# Patient Record
Sex: Male | Born: 1965 | Race: Black or African American | Hispanic: No | Marital: Married | Smoking: Never smoker
Health system: Southern US, Community
[De-identification: ages and names within clinical notes are randomized; demographics above are authoritative.]

## PROBLEM LIST (undated history)

## (undated) DIAGNOSIS — F329 Major depressive disorder, single episode, unspecified: Secondary | ICD-10-CM

## (undated) DIAGNOSIS — I10 Essential (primary) hypertension: Secondary | ICD-10-CM

## (undated) DIAGNOSIS — N183 Chronic kidney disease, stage 3 unspecified: Secondary | ICD-10-CM

## (undated) DIAGNOSIS — F32A Depression, unspecified: Secondary | ICD-10-CM

## (undated) DIAGNOSIS — E7800 Pure hypercholesterolemia, unspecified: Secondary | ICD-10-CM

## (undated) DIAGNOSIS — I5022 Chronic systolic (congestive) heart failure: Secondary | ICD-10-CM

## (undated) DIAGNOSIS — I4729 Other ventricular tachycardia: Secondary | ICD-10-CM

## (undated) DIAGNOSIS — I472 Ventricular tachycardia: Secondary | ICD-10-CM

## (undated) DIAGNOSIS — I428 Other cardiomyopathies: Secondary | ICD-10-CM

## (undated) HISTORY — DX: Other cardiomyopathies: I42.8

## (undated) HISTORY — DX: Other ventricular tachycardia: I47.29

## (undated) HISTORY — PX: NO PAST SURGERIES: SHX2092

## (undated) HISTORY — DX: Ventricular tachycardia: I47.2

## (undated) HISTORY — DX: Chronic kidney disease, stage 3 (moderate): N18.3

## (undated) HISTORY — DX: Chronic kidney disease, stage 3 unspecified: N18.30

## (undated) HISTORY — DX: Chronic systolic (congestive) heart failure: I50.22

---

## 2005-06-06 ENCOUNTER — Ambulatory Visit: Payer: Self-pay | Admitting: Internal Medicine

## 2005-06-24 DIAGNOSIS — I10 Essential (primary) hypertension: Secondary | ICD-10-CM

## 2005-06-24 HISTORY — DX: Essential (primary) hypertension: I10

## 2007-07-13 ENCOUNTER — Ambulatory Visit: Payer: Self-pay | Admitting: Internal Medicine

## 2007-07-13 DIAGNOSIS — E785 Hyperlipidemia, unspecified: Secondary | ICD-10-CM

## 2007-07-13 DIAGNOSIS — J309 Allergic rhinitis, unspecified: Secondary | ICD-10-CM | POA: Insufficient documentation

## 2007-07-13 DIAGNOSIS — I10 Essential (primary) hypertension: Secondary | ICD-10-CM

## 2007-08-20 ENCOUNTER — Ambulatory Visit: Payer: Self-pay | Admitting: Internal Medicine

## 2009-08-04 ENCOUNTER — Ambulatory Visit: Payer: Self-pay | Admitting: Internal Medicine

## 2009-08-04 DIAGNOSIS — G4700 Insomnia, unspecified: Secondary | ICD-10-CM

## 2009-08-04 DIAGNOSIS — F329 Major depressive disorder, single episode, unspecified: Secondary | ICD-10-CM | POA: Insufficient documentation

## 2009-08-04 LAB — CONVERTED CEMR LAB
AST: 19 units/L (ref 0–37)
Alkaline Phosphatase: 84 units/L (ref 39–117)
Bilirubin Urine: NEGATIVE
Bilirubin, Direct: 0.2 mg/dL (ref 0.0–0.3)
CO2: 30 meq/L (ref 19–32)
Chloride: 105 meq/L (ref 96–112)
HDL: 55.3 mg/dL (ref >39.00)
Ketones, ur: NEGATIVE mg/dL
LDL Cholesterol: 105 mg/dL — ABNORMAL HIGH (ref 0–99)
Leukocytes, UA: NEGATIVE
Nitrite: NEGATIVE
Potassium: 4 meq/L (ref 3.5–5.1)
Sodium: 140 meq/L (ref 135–145)
TSH: 1.49 microintl units/mL (ref 0.35–5.50)
Total Bilirubin: 0.5 mg/dL (ref 0.3–1.2)
Total CHOL/HDL Ratio: 3
Total Protein: 8 g/dL (ref 6.0–8.3)
Triglycerides: 40 mg/dL (ref 0.0–149.0)
Urine Glucose: NEGATIVE mg/dL
VLDL: 8 mg/dL (ref 0.0–40.0)
pH: 5.5 (ref 5.0–8.0)

## 2009-08-05 LAB — CONVERTED CEMR LAB
Basophils Absolute: 0 10*3/uL (ref 0.0–0.1)
Basophils Relative: 1 % (ref 0–1)
Eosinophils Absolute: 0.1 10*3/uL (ref 0.0–0.7)
Lymphs Abs: 1.6 10*3/uL (ref 0.7–4.0)
MCHC: 32.6 g/dL (ref 30.0–36.0)
Monocytes Absolute: 0.5 10*3/uL (ref 0.1–1.0)
Monocytes Relative: 9 % (ref 3–12)
Neutrophils Relative %: 61 % (ref 43–77)
Platelets: 177 10*3/uL (ref 150–400)
RBC: 5.69 M/uL (ref 4.22–5.81)
RDW: 14.7 % (ref 11.5–15.5)
WBC: 5.7 10*3/uL (ref 4.0–10.5)

## 2010-01-05 ENCOUNTER — Encounter: Admission: RE | Admit: 2010-01-05 | Discharge: 2010-01-05 | Payer: Self-pay | Admitting: Chiropractic Medicine

## 2010-01-10 ENCOUNTER — Encounter: Admission: RE | Admit: 2010-01-10 | Discharge: 2010-01-10 | Payer: Self-pay | Admitting: Chiropractic Medicine

## 2010-07-22 LAB — CONVERTED CEMR LAB
ALT: 23 units/L (ref 0–53)
AST: 21 units/L (ref 0–37)
Basophils Relative: 2.3 % — ABNORMAL HIGH (ref 0.0–1.0)
CO2: 30 meq/L (ref 19–32)
Calcium: 9.4 mg/dL (ref 8.4–10.5)
Cholesterol: 183 mg/dL (ref 0–200)
Creatinine, Ser: 1.5 mg/dL (ref 0.4–1.5)
Crystals: NEGATIVE
Eosinophils Relative: 1.5 % (ref 0.0–5.0)
GFR calc Af Amer: 66 mL/min
Glucose, Bld: 93 mg/dL (ref 70–99)
Ketones, ur: NEGATIVE mg/dL
LDL Cholesterol: 127 mg/dL — ABNORMAL HIGH (ref 0–99)
Lymphocytes Relative: 28.4 % (ref 12.0–46.0)
MCHC: 34.1 g/dL (ref 30.0–36.0)
MCV: 81.9 fL (ref 78.0–100.0)
Mucus, UA: NEGATIVE
Nitrite: NEGATIVE
PSA: 0.51 ng/mL (ref 0.10–4.00)
RBC: 5.32 M/uL (ref 4.22–5.81)
RDW: 14.3 % (ref 11.5–14.6)
Sodium: 137 meq/L (ref 135–145)
Total Bilirubin: 0.9 mg/dL (ref 0.3–1.2)
Total CHOL/HDL Ratio: 4.3
Total Protein: 8.1 g/dL (ref 6.0–8.3)
Urine Glucose: NEGATIVE mg/dL

## 2010-07-24 NOTE — Assessment & Plan Note (Signed)
Summary: FU---STC   Vital Signs:  Patient profile:   45 year old male Height:      70 inches Weight:      254.25 pounds BMI:     36.61 O2 Sat:      98 % on Room air Temp:     98.3 degrees F oral Pulse rate:   77 / minute BP sitting:   148 / 100  (left arm) Cuff size:   large  Vitals Entered ByZella Ball Ewing (August 04, 2009 10:00 AM)  O2 Flow:  Room air CC: followup/RE   CC:  followup/RE.  History of Present Illness: working 2 jobs, and part -time school - lots of stress and mild increased depressive symptoms, and difficutly staying asleep for 5 yrs;  overall has maintained wt;  some days wakes up with headache;  generally takes a nap during the day, no trouble with driving and sleepiness;  BP at [employer] similar to today;  Pt denies CP, sob, doe, wheezing, orthopnea, pnd, worsening LE edema, palps, dizziness or syncope   Pt denies new neuro symptoms such as other headache, facial or extremity weakness   Preventive Screening-Counseling & Management      Drug Use:  no.    Problems Prior to Update: 1)  Depression  (ICD-311) 2)  Insomnia-sleep Disorder-unspec  (ICD-780.52) 3)  Hyperlipidemia  (ICD-272.4) 4)  Hypertension  (ICD-401.9) 5)  Allergic Rhinitis  (ICD-477.9) 6)  Preventive Health Care  (ICD-V70.0)  Medications Prior to Update: 1)  Benazepril Hcl 40 Mg  Tabs (Benazepril Hcl) .Marland Kitchen.. 1po Qd 2)  Ecotrin Low Strength 81 Mg  Tbec (Aspirin) .Marland Kitchen.. 1po Qd 3)  Unisom 25 Mg  Tabs (Doxylamine Succinate (Sleep)) .... Take 1 By Mouth Qhs 4)  Cetirizine Hcl 10 Mg  Tabs (Cetirizine Hcl) .Marland Kitchen.. 1po Once Daily Prn  Current Medications (verified): 1)  Benazepril Hcl 40 Mg  Tabs (Benazepril Hcl) .Marland Kitchen.. 1po Once Daily 2)  Ecotrin Low Strength 81 Mg  Tbec (Aspirin) .Marland Kitchen.. 1po Once Daily 3)  Unisom 25 Mg  Tabs (Doxylamine Succinate (Sleep)) .... Take 1 By Mouth Qhs 4)  Amlodipine Besylate 5 Mg Tabs (Amlodipine Besylate) .Marland Kitchen.. 1 By Mouth Once Daily 5)  Citalopram Hydrobromide 20 Mg Tabs  (Citalopram Hydrobromide) .Marland Kitchen.. 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         all otherwise negative per pt -   Physical Exam  General:  alert and overweight-appearing.   Head:  normocephalic and atraumatic.   Eyes:  vision grossly intact, pupils equal, and pupils round.  Ears:  R ear normal and L ear normal.   Nose:  no external deformity and no nasal discharge.   Mouth:  no gingival abnormalities and pharynx pink and moist.   Neck:  supple and no masses.   Lungs:  normal respiratory effort and normal breath sounds.   Heart:  normal rate and regular rhythm.   Abdomen:  soft, non-tender, and normal bowel sounds.   Msk:  no joint tenderness and no joint swelling.   Extremities:  no edema, no erythema    Neurologic:  cranial nerves II-XII intact and strength normal in all extremities.   Psych:  not anxious appearing and depressed affect.     Impression & Recommendations:  Problem # 1:  Preventive Health Care (ICD-V70.0)  Overall doing well, age appropriate education and counseling updated and referral for appropriate preventive services done unless declined, immunizations up to date or declined, diet counseling done if overweight, urged to quit smoking if smokes , most recent labs reviewed and current ordered if appropriate, ecg reviewed or declined (interpretation per ECG scanned in the EMR if done); information regarding Medicare Prevention requirements given if appropriate   Orders: TLB-BMP (Basic Metabolic Panel-BMET) (80048-METABOL) TLB-Hepatic/Liver Function Pnl (80076-HEPATIC) TLB-Lipid Panel (80061-LIPID) TLB-TSH (Thyroid Stimulating Hormone) (84443-TSH) TLB-PSA (Prostate Specific Antigen) (84153-PSA) TLB-Udip ONLY (81003-UDIP)  Problem # 2:  INSOMNIA-SLEEP DISORDER-UNSPEC (ICD-780.52)  His updated medication list for this problem includes:    Unisom 25 Mg Tabs (Doxylamine succinate (sleep)) .Marland Kitchen... Take 1 by mouth qhs declines other med at this time,  Problem # 3:  HYPERTENSION (ICD-401.9)  His updated medication list for this problem includes:    Benazepril Hcl 40 Mg Tabs (Benazepril hcl) .Marland Kitchen... 1po once daily    Amlodipine Besylate 5 Mg Tabs (Amlodipine besylate) .Marland Kitchen... 1 by mouth once daily add the amlodipine, f/ BP freq as he does, f/u for any persistent > 140/90  Problem # 4:  DEPRESSION (ICD-311)  His updated medication list for this problem includes:    Citalopram Hydrobromide 20 Mg Tabs (Citalopram hydrobromide) .Marland Kitchen... 1 by mouth once daily mild, treat as above, f/u any worsening signs or symptoms   Complete Medication List: 1)  Benazepril Hcl 40 Mg Tabs (Benazepril hcl) .Marland Kitchen.. 1po once daily 2)  Ecotrin Low Strength 81 Mg Tbec (Aspirin) .Marland Kitchen.. 1po once daily 3)   Unisom 25 Mg Tabs (Doxylamine succinate (sleep)) .... Take 1 by mouth qhs 4)  Amlodipine Besylate 5 Mg Tabs (Amlodipine besylate) .Marland Kitchen.. 1 by mouth once daily 5)  Citalopram Hydrobromide 20 Mg Tabs (Citalopram hydrobromide) .Marland Kitchen.. 1 by mouth once daily  Other Orders: Tdap => 28yrs IM (16109) Admin 1st Vaccine (60454)  Patient Instructions: 1)  you had the tetanus shot today 2)  start the amlodipine 5 mg per day 3)  start the citalopram 20 mg  - 1 per day - Start with HALF pill for 3 days, then whole pill after that 4)  Continue all previous medications as before this visit  5)  Please go to the Lab in the basement for your blood and/or urine tests today 6)  Please schedule a follow-up appointment in 6 mo or sooner if needed 7)  Check your Blood Pressure regularly. If it is above 140/90: you should make an appointment. Prescriptions: CITALOPRAM HYDROBROMIDE 20 MG TABS (CITALOPRAM HYDROBROMIDE) 1 by mouth once daily  #90 x 3   Entered and Authorized by:   Corwin Levins MD   Signed by:   Corwin Levins MD on  08/04/2009   Method used:   Print then Give to Patient   RxID:   [id number] AMLODIPINE BESYLATE 5 MG TABS (AMLODIPINE BESYLATE) 1 by mouth once daily  #90 x 3   Entered and Authorized by:   Corwin Levins MD   Signed by:   Corwin Levins MD on 08/04/2009   Method used:   Print then Give to Patient   RxID:   [id number] BENAZEPRIL HCL 40 MG  TABS (BENAZEPRIL HCL) 1po once daily  #90 x 3   Entered and Authorized by:   Corwin Levins MD   Signed by:   Corwin Levins MD on 08/04/2009   Method used:   Print then Give to Patient   RxID:   [id number]    Immunizations Administered:  Tetanus Vaccine:    Vaccine Type: Tdap    Site: left deltoid    Mfr: GlaxoSmithKline    Dose: 0.5 ml    Route: IM    Given by: Robin Ewing    Exp. Date: 08/19/2011    Lot #: [id number]    VIS given: 05/12/07 version given August 04, 2009.
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
## 2012-02-24 ENCOUNTER — Emergency Department (HOSPITAL_COMMUNITY): Payer: BC Managed Care – PPO

## 2012-02-24 ENCOUNTER — Encounter (HOSPITAL_COMMUNITY): Payer: Self-pay | Admitting: Emergency Medicine

## 2012-02-24 ENCOUNTER — Ambulatory Visit (INDEPENDENT_AMBULATORY_CARE_PROVIDER_SITE_OTHER): Payer: BC Managed Care – PPO | Admitting: Emergency Medicine

## 2012-02-24 ENCOUNTER — Inpatient Hospital Stay (HOSPITAL_COMMUNITY)
Admission: EM | Admit: 2012-02-24 | Discharge: 2012-02-27 | DRG: 127 | Disposition: A | Discharge status: Home / Self Care | Payer: BC Managed Care – PPO | Attending: Internal Medicine | Admitting: Internal Medicine

## 2012-02-24 VITALS — BP 151/105 | HR 114 | Temp 98.8°F | Resp 16 | Ht 70.0 in | Wt 254.4 lb

## 2012-02-24 DIAGNOSIS — I10 Essential (primary) hypertension: Secondary | ICD-10-CM | POA: Diagnosis present

## 2012-02-24 DIAGNOSIS — G4700 Insomnia, unspecified: Secondary | ICD-10-CM

## 2012-02-24 DIAGNOSIS — Z7982 Long term (current) use of aspirin: Secondary | ICD-10-CM

## 2012-02-24 DIAGNOSIS — I5021 Acute systolic (congestive) heart failure: Principal | ICD-10-CM | POA: Diagnosis present

## 2012-02-24 DIAGNOSIS — Z91199 Patient's noncompliance with other medical treatment and regimen due to unspecified reason: Secondary | ICD-10-CM

## 2012-02-24 DIAGNOSIS — E785 Hyperlipidemia, unspecified: Secondary | ICD-10-CM | POA: Diagnosis present

## 2012-02-24 DIAGNOSIS — R079 Chest pain, unspecified: Secondary | ICD-10-CM

## 2012-02-24 DIAGNOSIS — R0602 Shortness of breath: Secondary | ICD-10-CM

## 2012-02-24 DIAGNOSIS — R7989 Other specified abnormal findings of blood chemistry: Secondary | ICD-10-CM

## 2012-02-24 DIAGNOSIS — E876 Hypokalemia: Secondary | ICD-10-CM | POA: Diagnosis present

## 2012-02-24 DIAGNOSIS — J309 Allergic rhinitis, unspecified: Secondary | ICD-10-CM

## 2012-02-24 DIAGNOSIS — Z79899 Other long term (current) drug therapy: Secondary | ICD-10-CM

## 2012-02-24 DIAGNOSIS — I498 Other specified cardiac arrhythmias: Secondary | ICD-10-CM | POA: Diagnosis present

## 2012-02-24 DIAGNOSIS — F3289 Other specified depressive episodes: Secondary | ICD-10-CM | POA: Diagnosis present

## 2012-02-24 DIAGNOSIS — Z9119 Patient's noncompliance with other medical treatment and regimen: Secondary | ICD-10-CM

## 2012-02-24 DIAGNOSIS — E7800 Pure hypercholesterolemia, unspecified: Secondary | ICD-10-CM | POA: Diagnosis present

## 2012-02-24 DIAGNOSIS — R799 Abnormal finding of blood chemistry, unspecified: Secondary | ICD-10-CM | POA: Diagnosis present

## 2012-02-24 DIAGNOSIS — J811 Chronic pulmonary edema: Secondary | ICD-10-CM | POA: Diagnosis present

## 2012-02-24 DIAGNOSIS — F329 Major depressive disorder, single episode, unspecified: Secondary | ICD-10-CM

## 2012-02-24 DIAGNOSIS — R0609 Other forms of dyspnea: Secondary | ICD-10-CM

## 2012-02-24 DIAGNOSIS — I509 Heart failure, unspecified: Secondary | ICD-10-CM | POA: Diagnosis present

## 2012-02-24 DIAGNOSIS — I428 Other cardiomyopathies: Secondary | ICD-10-CM | POA: Diagnosis present

## 2012-02-24 HISTORY — DX: Major depressive disorder, single episode, unspecified: F32.9

## 2012-02-24 HISTORY — DX: Pure hypercholesterolemia, unspecified: E78.00

## 2012-02-24 HISTORY — DX: Depression, unspecified: F32.A

## 2012-02-24 HISTORY — DX: Essential (primary) hypertension: I10

## 2012-02-24 LAB — RAPID URINE DRUG SCREEN, HOSP PERFORMED
Amphetamines: NOT DETECTED
Opiates: NOT DETECTED
Tetrahydrocannabinol: NOT DETECTED

## 2012-02-24 LAB — URINALYSIS, ROUTINE W REFLEX MICROSCOPIC
Glucose, UA: NEGATIVE mg/dL
Hgb urine dipstick: NEGATIVE
Ketones, ur: NEGATIVE mg/dL
Protein, ur: 100 mg/dL — AB

## 2012-02-24 LAB — CBC WITH DIFFERENTIAL/PLATELET
Basophils Relative: 0 % (ref 0–1)
HCT: 42.7 % (ref 39.0–52.0)
Hemoglobin: 14.3 g/dL (ref 13.0–17.0)
Lymphs Abs: 1 10*3/uL (ref 0.7–4.0)
MCHC: 33.5 g/dL (ref 30.0–36.0)
Monocytes Relative: 8 % (ref 3–12)
Platelets: 187 10*3/uL (ref 150–400)
RDW: 15.5 % (ref 11.5–15.5)
WBC: 9.8 10*3/uL (ref 4.0–10.5)

## 2012-02-24 LAB — BASIC METABOLIC PANEL
CO2: 28 mEq/L (ref 19–32)
Chloride: 105 mEq/L (ref 96–112)
Glucose, Bld: 101 mg/dL — ABNORMAL HIGH (ref 70–99)
Potassium: 3.7 mEq/L (ref 3.5–5.1)
Sodium: 142 mEq/L (ref 135–145)

## 2012-02-24 LAB — TROPONIN I: Troponin I: <0.3 ng/mL (ref <0.30)

## 2012-02-24 LAB — CK TOTAL AND CKMB (NOT AT ARMC): Total CK: 192 U/L (ref 7–232)

## 2012-02-24 LAB — URINE MICROSCOPIC-ADD ON

## 2012-02-24 MED ORDER — ONDANSETRON HCL 4 MG/2ML IJ SOLN: 4.0000 mg | Freq: Four times a day (QID) | INTRAMUSCULAR | Status: DC | PRN

## 2012-02-24 MED ORDER — FUROSEMIDE 10 MG/ML IJ SOLN
60.0000 mg | Freq: Two times a day (BID) | INTRAMUSCULAR | Status: DC
  Administered 2012-02-25: 60 mg via INTRAVENOUS
  Filled 2012-02-24 (×2): qty 6

## 2012-02-24 MED ORDER — HYDRALAZINE HCL 20 MG/ML IJ SOLN
10.0000 mg | Freq: Four times a day (QID) | INTRAMUSCULAR | Status: DC | PRN
  Filled 2012-02-24: qty 0.5

## 2012-02-24 MED ORDER — METOPROLOL TARTRATE 50 MG PO TABS
50.0000 mg | ORAL_TABLET | Freq: Two times a day (BID) | ORAL | Status: DC
  Administered 2012-02-24 – 2012-02-26 (×4): 50 mg via ORAL
  Filled 2012-02-24 (×5): qty 1

## 2012-02-24 MED ORDER — ASPIRIN EC 325 MG PO TBEC
325.0000 mg | DELAYED_RELEASE_TABLET | Freq: Every day | ORAL | Status: DC
  Administered 2012-02-24 – 2012-02-26 (×3): 325 mg via ORAL
  Filled 2012-02-24 (×3): qty 1

## 2012-02-24 MED ORDER — ACETAMINOPHEN 325 MG PO TABS
650.0000 mg | ORAL_TABLET | ORAL | Status: DC | PRN
  Administered 2012-02-25: 650 mg via ORAL
  Filled 2012-02-24: qty 2

## 2012-02-24 MED ORDER — NITROGLYCERIN 2 % TD OINT
0.5000 [in_us] | TOPICAL_OINTMENT | Freq: Four times a day (QID) | TRANSDERMAL | Status: DC
  Administered 2012-02-24 – 2012-02-25 (×3): 0.5 [in_us] via TOPICAL
  Filled 2012-02-24: qty 30

## 2012-02-24 MED ORDER — ALBUTEROL SULFATE (5 MG/ML) 0.5% IN NEBU
2.5000 mg | INHALATION_SOLUTION | RESPIRATORY_TRACT | Status: DC
  Administered 2012-02-24: 2.5 mg via RESPIRATORY_TRACT
  Filled 2012-02-24: qty 0.5

## 2012-02-24 MED ORDER — IPRATROPIUM BROMIDE 0.02 % IN SOLN
0.5000 mg | RESPIRATORY_TRACT | Status: DC
  Administered 2012-02-24: 0.5 mg via RESPIRATORY_TRACT
  Filled 2012-02-24: qty 2.5

## 2012-02-24 MED ORDER — HEPARIN SODIUM (PORCINE) 5000 UNIT/ML IJ SOLN
5000.0000 [IU] | Freq: Three times a day (TID) | INTRAMUSCULAR | Status: DC
  Administered 2012-02-24 – 2012-02-27 (×9): 5000 [IU] via SUBCUTANEOUS
  Filled 2012-02-24 (×11): qty 1

## 2012-02-24 MED ORDER — NITROGLYCERIN IN D5W 200-5 MCG/ML-% IV SOLN: 5.0000 ug/min | Freq: Once | INTRAVENOUS | Status: DC

## 2012-02-24 MED ORDER — NITROGLYCERIN 2 % TD OINT
0.5000 [in_us] | TOPICAL_OINTMENT | Freq: Once | TRANSDERMAL | Status: AC
  Administered 2012-02-24: 0.5 [in_us] via TOPICAL
  Filled 2012-02-24: qty 1

## 2012-02-24 MED ORDER — FUROSEMIDE 10 MG/ML IJ SOLN
60.0000 mg | Freq: Once | INTRAMUSCULAR | Status: AC
Start: 2012-02-24 — End: 2012-02-24
  Administered 2012-02-24: 60 mg via INTRAVENOUS
  Filled 2012-02-24: qty 6

## 2012-02-24 MED ORDER — SODIUM CHLORIDE 0.9 % IJ SOLN
3.0000 mL | Freq: Two times a day (BID) | INTRAMUSCULAR | Status: DC
  Administered 2012-02-24 – 2012-02-25 (×3): 3 mL via INTRAVENOUS

## 2012-02-24 NOTE — H&P (Addendum)
Ian Bentley is an 46 y.o. male.    PCP: Oliver Barre, MD   Chief Complaint: Shortness of breath with exertion  HPI: This is a 46 year old, African American male, with past medical history of hypertension, for which he has not been on any medications for the last year and a half. He tells me that his dyspnea started back in January. However, it has been getting progressively worse. Over the last few weeks he's had a dry cough. He is unable to lie flat on his bed because of shortness of breath. He wakes up in the middle of the night feeling breathless as well. He denies any fever or chills. Has had feelings of chest pressure over the last many months, but none currently. He did have some chest pain last night, which has resolved on its own. He denies any palpitations. Denies any leg swelling that he has noticed. Had some nausea, but no vomiting. He's had a few episodes of lightheadedness, but never any syncopal episodes. He decided to seek attention today by going to urgent care Center from where he was sent over to the emergency department. He also admits to generalised weakness.    Home Medications: Prior to Admission medications   Not on File  He is not on any home medications at this time.  Allergies: No Known Allergies  Past Medical History: Past Medical History  Diagnosis Date  . Hypertension     He denies any surgeries in the past.  Social History:  reports that he has never smoked. He does not have any smokeless tobacco history on file. He reports that he does not drink alcohol or use illicit drugs.  Family History: He states that hypertension runs in his family. However, he was unable to specify.  Review of Systems - History obtained from the patient General ROS: positive for  - fatigue Psychological ROS: negative Ophthalmic ROS: negative ENT ROS: negative Allergy and Immunology ROS: negative Hematological and Lymphatic ROS: negative Endocrine ROS:  negative Respiratory ROS: as in hpi Cardiovascular ROS: as in hpi Gastrointestinal ROS: no abdominal pain, change in bowel habits, or black or bloody stools Genito-Urinary ROS: no dysuria, trouble voiding, or hematuria Musculoskeletal ROS: negative Neurological ROS: no TIA or stroke symptoms Dermatological ROS: negative  Physical Examination Blood pressure 168/120, pulse 105, temperature 98.3 F (36.8 C), temperature source Oral, resp. rate 21, SpO2 97.00%.  General appearance: alert, cooperative, appears stated age and no distress Head: Normocephalic, without obvious abnormality, atraumatic Eyes: conjunctivae/corneas clear. PERRL, EOM's intact. Fundi benign. Throat: lips, mucosa, and tongue normal; teeth and gums normal Neck: no adenopathy, no carotid bruit, no JVD, supple, symmetrical, trachea midline and thyroid not enlarged, symmetric, no tenderness/mass/nodules Back: symmetric, no curvature. ROM normal. No CVA tenderness. Resp: decreased air entry at the bases with crackles. no wheezing Cardio: S1, S2 tachycardic, regular. no murmur, click, rub or gallop. JVD present GI: soft, non-tender; bowel sounds normal; no masses,  no organomegaly Extremities: edema 1+ Pulses: 2+ and symmetric Skin: Skin color, texture, turgor normal. No rashes or lesions Lymph nodes: Cervical, supraclavicular, and axillary nodes normal. Neurologic: Alert and oriented x 3. No focal deficits  Laboratory Data: Results for orders placed during the hospital encounter of 02/24/12 (from the past 48 hour(s))  PRO B NATRIURETIC PEPTIDE     Status: Abnormal   Collection Time   02/24/12  2:20 PM      Component Value Range Comment   Pro B Natriuretic peptide (BNP) 6630.0 (*)  0 - 125 pg/mL   TROPONIN I     Status: Normal   Collection Time   02/24/12  2:20 PM      Component Value Range Comment   Troponin I <0.30  <0.30 ng/mL   BASIC METABOLIC PANEL     Status: Abnormal   Collection Time   02/24/12  2:20 PM       Component Value Range Comment   Sodium 142  135 - 145 mEq/L    Potassium 3.7  3.5 - 5.1 mEq/L    Chloride 105  96 - 112 mEq/L    CO2 28  19 - 32 mEq/L    Glucose, Bld 101 (*) 70 - 99 mg/dL    BUN 20  6 - 23 mg/dL    Creatinine, Ser 1.61 (*) 0.50 - 1.35 mg/dL    Calcium 9.0  8.4 - 09.6 mg/dL    GFR calc non Af Amer 48 (*) >90 mL/min    GFR calc Af Amer 55 (*) >90 mL/min   CBC WITH DIFFERENTIAL     Status: Abnormal   Collection Time   02/24/12  2:20 PM      Component Value Range Comment   WBC 9.8  4.0 - 10.5 K/uL    RBC 5.43  4.22 - 5.81 MIL/uL    Hemoglobin 14.3  13.0 - 17.0 g/dL    HCT 04.5  40.9 - 81.1 %    MCV 78.6  78.0 - 100.0 fL    MCH 26.3  26.0 - 34.0 pg    MCHC 33.5  30.0 - 36.0 g/dL    RDW 91.4  78.2 - 95.6 %    Platelets 187  150 - 400 K/uL    Neutrophils Relative 81 (*) 43 - 77 %    Neutro Abs 8.0 (*) 1.7 - 7.7 K/uL    Lymphocytes Relative 11 (*) 12 - 46 %    Lymphs Abs 1.0  0.7 - 4.0 K/uL    Monocytes Relative 8  3 - 12 %    Monocytes Absolute 0.8  0.1 - 1.0 K/uL    Eosinophils Relative 0  0 - 5 %    Eosinophils Absolute 0.0  0.0 - 0.7 K/uL    Basophils Relative 0  0 - 1 %    Basophils Absolute 0.0  0.0 - 0.1 K/uL     Radiology Reports: Dg Chest 2 View  02/24/2012  *RADIOLOGY REPORT*  Clinical Data: Chest pain.  Shortness of breath.  CHEST - 2 VIEW  Comparison: None.  Findings: Cardiac silhouette mildly to moderately enlarged. Pulmonary venous hypertension and mild interstitial pulmonary edema as evidenced by Charyl Dancer B lines.  No confluent airspace consolidation.  No pleural effusions.  Degenerative changes involving the thoracic spine.  IMPRESSION: Mild CHF, with mild to moderate cardiomegaly and mild interstitial pulmonary edema.   Original Report Authenticated By: Arnell Sieving, M.D.     Electrocardiogram: EKG shows sinus tachycardia at 116 beats per minute. Right axis deviation. Possible Q waves in V1 and V2. No acute ST or T-wave changes are noted. Other  nonspecific changes are seen. LVH may also be present  Assessment/Plan  Principal Problem:  *Dyspnea on exertion Active Problems:  Accelerated hypertension  Elevated serum creatinine   #1 dyspnea on exertion: Based on history of orthopnea, PND and clinic examination and testing so far, it appears that he may have congestive heart failure. Uncontrolled hypertension could be the reason. We will initiate Lasix. Nitroglycerin paste  will be utilized. Echocardiogram will be obtained. A TSH level will be checked. Lipid panel will be checked in the morning. He'll be given beta blockers. We will hold off as on his ACE inhibitors due to elevated creatinine. Depending on echocardiogram and his clinical progress cardiology input may be necessary.  #2 accelerated hypertension: This is primarily, because he's been noncompliant. Initiate beta blockers. He'll be given diuretics and nitro paste so, I anticipate some decrease in his blood pressure. Further management will be determined depending on how the patient progresses. Hydralazine as needed will be utilized for extremely high BPs (see orders for parameters).  #3 Chest Pain: None currently but he has experienced this in the last few months. Could be related to elevated BP. Troponin is negative. Will continue to cycle Troponin. CK will be checked. UDS will be checked. ECHO will provide information regarding wall motion.   #4 elevated serum creatinine: Review of his previous labs show that his creatinine was 1.3 in 2011. We will continue to monitor him closely. We'll check a UA. Depending on how the renal function does over the next day or 2 further management may be required.  #4 DVT, prophylaxis will be initiated.  He is a full code.  Further management decisions will depend on results of further testing and patient's response to treatment.  [facility]  [facility] Pager [phone]  02/24/2012, 5:11 PM

## 2012-02-24 NOTE — ED Provider Notes (Signed)
History     CSN: [id number]  Arrival date & time 02/24/12  1300   First MD Initiated Contact with Patient 02/24/12 1312      Chief Complaint  Patient presents with  . Shortness of Breath  . Chest Pain    (Consider location/radiation/quality/duration/timing/severity/associated sxs/prior treatment) The history is provided by the patient.   DETRON CARRAS is a 46 y.o. male with over a year's worth of intermittent dyspnea, occasional chest pain and cough.  The cough seems to be persistent following URI, but doesn't resolve for weeks.  The dyspnea comes and goes but has become worse over ththe last week to days associated with chest pains that are described as intermittent, pressure "an elephant on my chest" are worse when laying down, last about 30 seconds to 1 minute, resolve on sitting u. He has not been sleeping well recently and cannot lie flat or he feels smothered.  He using 2 pillows typically.  He presented today to urgent care with persistent cough and was referred to the ER after an EKG and screening exam.  He denies productive cough, fever, hemoptysis, h/o DVT or PE, no h/o GERD, cough is not worse in the AM, no association with food, no N/V/D or diaphoresis.    Past Medical History  Diagnosis Date  . Hypertension     History reviewed. No pertinent past surgical history.  History reviewed. No pertinent family history.  History  Substance Use Topics  . Smoking status: Never Smoker   . Smokeless tobacco: Not on file  . Alcohol Use: No      Review of Systems Patient denies any fevers or chills, changes in vision, earache, sore throat, neck pain or stiffness, palpitations, syncope, dyspnea, wheezing, abdominal pain, nausea, vomiting, diarrhea, melena, red bloody stools, frequency, dysuria, myalgias, arthralgias, back pain, recent trauma, rash, itching, skin lesions, easy bruising or bleeding, headache, seizures, numbness, tingling or weakness.   Allergies  Review of  patient's allergies indicates no known allergies.  Home Medications  No current outpatient prescriptions on file.  BP 164/107  Pulse 99  Resp 25  SpO2 100%  Physical Exam VITAL SIGNS:   Filed Vitals:   02/25/12 1009  BP: 149/96  Pulse: 95  Temp:   Resp:    CONSTITUTIONAL: Awake, oriented, appears non-toxic HENT: Atraumatic, normocephalic, oral mucosa pink and moist, airway patent. Nares patent without drainage. External ears normal. EYES: Conjunctiva clear, EOMI, PERRLA NECK: Trachea midline, non-tender, supple CARDIOVASCULAR: Normal heart rate, Normal rhythm  PULMONARY/CHEST: Decreased BS bilaterally with faint rales in the bases. Symmetrical breath sounds. Non-tender. ABDOMINAL: Non-distended, soft, non-tender - no rebound or guarding.  BS normal. NEUROLOGIC: Non-focal, moving all four extremities, no gross sensory or motor deficits. EXTREMITIES: No clubbing, cyanosis. 2+ edema, sock lines SKIN: Warm, Dry, No erythema, No rash  ED Course  Procedures (including critical care time)  Labs Reviewed - No data to display No results found.   No diagnosis found.    MDM  HAMZEH TALL is a 46 y.o. male past medical history of chin presenting with chest pain and shortness of breath on and off for a year. He was seen in urgent care today and refer to the emergency department. He says his shortness of breath and cough and worsening over the last week or so has clinical history is consistent with congestive heart failure though he has no history of that. Labs confirm this diagnosis with a BNP of over 6600, he does have a negative  troponin.  His EKG shows a sinus tachycardia with a rate of 116 beats per minute does have a right axis deviation, normal intervals and some nonspecific T-wave changes, there is no evidence of acute ischemia or infarction EKG, there is no prior EKGs to compare with.  Got patient up and walked him in the hallway - he desaturated to high 80's and was  tachypneic.  Pt will need admission as his heart failure has never been diagnosed, treated and now is hypoxemic on exertion.  D/W Dr. Rito Ehrlich from [facility] for admission - thank you for your assistance in caring for this patient.  Admitted, stable to telemetry.        Jones Skene, MD 02/25/12 1118

## 2012-02-24 NOTE — ED Notes (Signed)
Attempted to call report. RN will call back. 

## 2012-02-24 NOTE — ED Notes (Addendum)
Chest pain and SOB off and on for 1 year.  Pt was seen at Urgent Care and told to come to the ED today.  "He has had enough today and the regular doctor is not open today.  Pt has not seen his doctor in 1 yr."  +exertional SOB.  + nonproductive cough.  Dx of HTN but out of BP med since Feb 2012.

## 2012-02-24 NOTE — Progress Notes (Signed)
Patient transferred from ED into 4709 via stretcher. Patient oriented to surrounding in room and demonstrated call bell system to patient. CHF Education and Heart Failure folder provided to help patient understand why he has been brought to heart failure floor. Patient weighed with stand up scale, has voided with urinal, and telemetry box has been applied to patient. Blood pressure elevated in report from ED and upon arrival secondary to history of HTN. Patient complains of no signs or symptoms of pain. Will continue to monitor and report off to oncoming nurse.

## 2012-02-24 NOTE — ED Notes (Signed)
MD at bedside. 

## 2012-02-24 NOTE — Progress Notes (Signed)
  Subjective:    Patient ID: Ian Bentley, male    DOB: 10/13/1965, 46 y.o.   MRN: [id number]  HPI patient has a history of high blood pressure he has not been on medication for the last year. He has significant dyspnea on exertion. Last night for approximately 30 minutes he felt a severe pressure in the center of his chest associated with sitting on his chest. He began radiation of the pain up into his throat associated with nausea but has had no diaphoresis. He gets short of breath fairly easily. The patient states he has been having increasing episodes of this chest pain associated with shortness of breath.    Review of Systems     Objective:   Physical Exam  Constitutional: He appears well-developed and well-nourished.  Neck: Normal range of motion.  Cardiovascular:       I do not hear a gallop or murmur.  Pulmonary/Chest:       He is diminished breath sounds in the bases but no true wheezes. There is no dullness to percussion.   EKG shows a sinus tachycardia with diffuse minimal ST changes inferiorly. He does meet voltage criteria for LVH.        Assessment & Plan:     Patient here with abnormal EKG recurrent episodes of chest pain. His description of the pain is significant to be suspicious for coronary disease. O2 started is not currently having chest pain IV lines assertively placed on a monitor he will be transported to the emergency room for evaluation. History is suspicious for crescendo angina

## 2012-02-24 NOTE — ED Notes (Signed)
Urine sent to lab 

## 2012-02-25 DIAGNOSIS — E7800 Pure hypercholesterolemia, unspecified: Secondary | ICD-10-CM

## 2012-02-25 DIAGNOSIS — E876 Hypokalemia: Secondary | ICD-10-CM

## 2012-02-25 HISTORY — DX: Pure hypercholesterolemia, unspecified: E78.00

## 2012-02-25 LAB — TROPONIN I: Troponin I: <0.3 ng/mL (ref <0.30)

## 2012-02-25 LAB — CBC
Hemoglobin: 13.4 g/dL (ref 13.0–17.0)
MCH: 26.5 pg (ref 26.0–34.0)
RBC: 5.06 MIL/uL (ref 4.22–5.81)
WBC: 10.9 10*3/uL — ABNORMAL HIGH (ref 4.0–10.5)

## 2012-02-25 LAB — LIPID PANEL
HDL: 38 mg/dL — ABNORMAL LOW (ref >39)
LDL Cholesterol: 76 mg/dL (ref 0–99)
Total CHOL/HDL Ratio: 3.5 RATIO
Triglycerides: 94 mg/dL (ref <150)

## 2012-02-25 LAB — COMPREHENSIVE METABOLIC PANEL
ALT: 60 U/L — ABNORMAL HIGH (ref 0–53)
Alkaline Phosphatase: 59 U/L (ref 39–117)
CO2: 29 mEq/L (ref 19–32)
Chloride: 104 mEq/L (ref 96–112)
GFR calc Af Amer: 57 mL/min — ABNORMAL LOW (ref >90)
GFR calc non Af Amer: 49 mL/min — ABNORMAL LOW (ref >90)
Glucose, Bld: 103 mg/dL — ABNORMAL HIGH (ref 70–99)
Potassium: 3 mEq/L — ABNORMAL LOW (ref 3.5–5.1)
Sodium: 141 mEq/L (ref 135–145)
Total Bilirubin: 1.2 mg/dL (ref 0.3–1.2)

## 2012-02-25 LAB — BASIC METABOLIC PANEL
BUN: 19 mg/dL (ref 6–23)
CO2: 29 mEq/L (ref 19–32)
Calcium: 9.3 mg/dL (ref 8.4–10.5)
Creatinine, Ser: 1.96 mg/dL — ABNORMAL HIGH (ref 0.50–1.35)
GFR calc Af Amer: 45 mL/min — ABNORMAL LOW (ref >90)

## 2012-02-25 LAB — TSH: TSH: 1.824 u[IU]/mL (ref 0.350–4.500)

## 2012-02-25 LAB — MAGNESIUM: Magnesium: 2.1 mg/dL (ref 1.5–2.5)

## 2012-02-25 MED ORDER — FUROSEMIDE 10 MG/ML IJ SOLN
INTRAMUSCULAR | Status: AC
  Administered 2012-02-25: 60 mg via INTRAVENOUS
  Filled 2012-02-25: qty 8

## 2012-02-25 MED ORDER — FUROSEMIDE 10 MG/ML IJ SOLN
60.0000 mg | Freq: Every day | INTRAMUSCULAR | Status: DC
  Filled 2012-02-25: qty 6

## 2012-02-25 MED ORDER — POTASSIUM CHLORIDE CRYS ER 20 MEQ PO TBCR
40.0000 meq | EXTENDED_RELEASE_TABLET | ORAL | Status: AC
  Administered 2012-02-25 (×2): 40 meq via ORAL
  Filled 2012-02-25 (×2): qty 2

## 2012-02-25 NOTE — Progress Notes (Signed)
Pt. With 9 beat run of V-Tach. Pt. Asymptomatic and  denies pain or any discomfort. VS stable. MD notified via text page.  Will Continue to monitor pt. For changes in conditon. Alcee Sipos, Cheryll Dessert

## 2012-02-25 NOTE — Clinical Social Work Psychosocial (Signed)
     Clinical Social Work Department BRIEF PSYCHOSOCIAL ASSESSMENT 02/25/2012  Patient:  ROHAAN, DURNIL     Account Number:  [id number]     Admit date:  02/24/2012  Clinical Social Worker:  Hulan Fray  Date/Time:  02/25/2012 02:52 PM  Referred by:  RN  Date Referred:  02/24/2012 Referred for  Advanced Directives   Other Referral:   Interview type:  Patient Other interview type:    PSYCHOSOCIAL DATA Living Status:  WIFE Admitted from facility:   Level of care:   Primary support name:  Alice Primary support relationship to patient:  SPOUSE Degree of support available:   supportive    CURRENT CONCERNS Current Concerns  Other - See comment   Other Concerns:   Advance directives    SOCIAL WORK ASSESSMENT / PLAN Clinical Social Worker received referral for advance directive request. CSW introduced self and explained reason for visit. Patient's mother was at bedside during assessment. CSW provided patient with advance directive packet and MOST form. CSW explained each form and informed patient that documents can be notarized during this hospital admission. CSW also informed patient of other places that may notarize the forms. Patient did not have any further questions. CSW will sign off. Please re-consult if patient wishes to have forms notarized during this admission.   Assessment/plan status:  No Further Intervention Required Other assessment/ plan:   Information/referral to community resources:   Advance directive packet  MOST form    PATIENTS/FAMILYS RESPONSE TO PLAN OF CARE: Patient was agreeable to receiving advance directive packet and MOST form. Patient was appreciative of CSW's assistance.

## 2012-02-25 NOTE — Progress Notes (Signed)
PCP: Oliver Barre, MD  Brief HPI: This is a 46 year old, African American male, with past medical history of hypertension, for which he has not been on any medications for the last year and a half. He mentioned that his dyspnea started back in January. However, it had been getting progressively worse. Over the last few weeks he's had a dry cough. He is unable to lie flat on his bed because of shortness of breath. He wakes up in the middle of the night feeling breathless as well. He denied any fever or chills. Has had feelings of chest pressure over the last many months, but none currently. He did have some chest pain the night prior to admission, which resolved on its own. He denied any palpitations. Denied any leg swelling that he has noticed. Had some nausea, but no vomiting. He's had a few episodes of lightheadedness, but never any syncopal episodes. He decided to seek attention today by going to urgent care Center from where he was sent over to the emergency department. He also admits to generalised weakness.   Past medical history:  Past Medical History  Diagnosis Date  . Hypertension   . High cholesterol   . CHF (congestive heart failure) 02/24/2012  . Bronchitis 02/24/2012    "when I was a kid"  . Shortness of breath 02/24/2012    "laying down; w/exertion"   Consultants: None  Procedures: None  Subjective: Patient is feeling better. Urinating a lot. Denies any chest pain. Ambulating more easily.  Objective: Vital signs in last 24 hours: Temp:  [98 F (36.7 C)-98.8 F (37.1 C)] 98.3 F (36.8 C) (09/03 0510) Pulse Rate:  [79-116] 95  (09/03 1009) Resp:  [16-25] 16  (09/03 0510) BP: (117-172)/(76-125) 149/96 mmHg (09/03 1009) SpO2:  [93 %-100 %] 94 % (09/03 0510) Weight:  [110.2 kg (242 lb 15.2 oz)-112.038 kg (247 lb)] 110.2 kg (242 lb 15.2 oz) (09/03 0510) Weight change:  Last BM Date: 02/24/12  Intake/Output from previous day: 09/02 0701 - 09/03 0700 In: 243 [P.O.:240;  I.V.:3] Out: 4000 [Urine:4000] Intake/Output this shift: Total I/O In: 363 [P.O.:360; I.V.:3] Out: 1225 [Urine:1225]  General appearance: alert, cooperative, appears stated age and no distress Head: Normocephalic, without obvious abnormality, atraumatic Resp: Improved air entry. Less crackles. Cardio: regular rate and rhythm, S1, S2 normal, no murmur, click, rub or gallop GI: soft, non-tender; bowel sounds normal; no masses,  no organomegaly Extremities: Improved edema Pulses: 2+ and symmetric Neurologic: A&Ox3. No focal deficits.  Lab Results:  Basename 02/25/12 0100 02/24/12 1420  WBC 10.9* 9.8  HGB 13.4 14.3  HCT 39.9 42.7  PLT 159 187   BMET  Basename 02/25/12 0100 02/24/12 1420  NA 141 142  K 3.0* 3.7  CL 104 105  CO2 29 28  GLUCOSE 103* 101*  BUN 19 20  CREATININE 1.61* 1.65*  CALCIUM 8.8 9.0  ALT 60* --    Studies/Results: Dg Chest 2 View  02/24/2012  *RADIOLOGY REPORT*  Clinical Data: Chest pain.  Shortness of breath.  CHEST - 2 VIEW  Comparison: None.  Findings: Cardiac silhouette mildly to moderately enlarged. Pulmonary venous hypertension and mild interstitial pulmonary edema as evidenced by Charyl Dancer B lines.  No confluent airspace consolidation.  No pleural effusions.  Degenerative changes involving the thoracic spine.  IMPRESSION: Mild CHF, with mild to moderate cardiomegaly and mild interstitial pulmonary edema.   Original Report Authenticated By: Arnell Sieving, M.D.     Medications:  Scheduled:    .  aspirin EC  325 mg Oral Daily  . furosemide  60 mg Intravenous Once  . furosemide  60 mg Intravenous Q12H  . heparin  5,000 Units Subcutaneous Q8H  . metoprolol tartrate  50 mg Oral BID  . nitroGLYCERIN  0.5 inch Topical Once  . potassium chloride  40 mEq Oral Q4H  . sodium chloride  3 mL Intravenous Q12H  . DISCONTD: albuterol  2.5 mg Nebulization Q4H  . DISCONTD: ipratropium  0.5 mg Nebulization Q4H  . DISCONTD: nitroGLYCERIN  0.5 inch Topical  Q6H  . DISCONTD: nitroGLYCERIN  5 mcg/min Intravenous Once   Continuous:  ZOX:[id number], hydrALAZINE, ondansetron (ZOFRAN) IV  Assessment/Plan:  Principal Problem:  *Dyspnea on exertion Active Problems:  Accelerated hypertension  Elevated serum creatinine  Chest pain  Pulmonary edema    Dyspnea on exertion This was likely due to pulmonary edema. Patient has diuresed well. ECHO is pending. Will decrease dose of lasix. TSH was normal. This is likely from hypertensive heart disease. We will hold off on ACE inhibitors due to elevated creatinine. Depending on echocardiogram cardiology input may be necessary. But this could be done as outpatient. May need to change medications if his EF is low.  Accelerated hypertension This is primarily, because he's been noncompliant. Improved with beta blockers and diuretics. Can discontinue NTP. Hydralazine as needed will be utilized for extremely high BPs (see orders for parameters).   Transient Chest Pain Has ruled out for ACS. Is likely from hypertension. Will need further evaluation as OP. ECHO will give information regarding wall motion.  Elevated serum creatinine Some improvement today. Review of his previous labs show that his creatinine was 1.3 in 2011. We will continue to monitor him closely. UA showed mild proteinuria. May have CKD from HTN. Further evaluation as OP.  Hypokalemia Replete K.  DVT prophylaxis With heparin  Code Status He is a full code.  Disposition Will go home when stable. Anticipate discharge in AM   LOS: 1 day   [facility]  [facility] Pager [phone] 02/25/2012, 11:48 AM

## 2012-02-25 NOTE — Progress Notes (Signed)
Pt. With K+ level of 3.0 this am. MD notified via text page. Will continue to monitor pt. And inform oncoming RN of lab value. Karter Haire, Cheryll Dessert

## 2012-02-26 DIAGNOSIS — I059 Rheumatic mitral valve disease, unspecified: Secondary | ICD-10-CM

## 2012-02-26 LAB — BASIC METABOLIC PANEL
BUN: 21 mg/dL (ref 6–23)
Chloride: 105 mEq/L (ref 96–112)
Creatinine, Ser: 1.7 mg/dL — ABNORMAL HIGH (ref 0.50–1.35)
GFR calc Af Amer: 53 mL/min — ABNORMAL LOW (ref >90)
GFR calc non Af Amer: 46 mL/min — ABNORMAL LOW (ref >90)

## 2012-02-26 LAB — CBC
HCT: 43.9 % (ref 39.0–52.0)
MCHC: 33.3 g/dL (ref 30.0–36.0)
MCV: 79.5 fL (ref 78.0–100.0)
Platelets: 170 10*3/uL (ref 150–400)
RDW: 15.8 % — ABNORMAL HIGH (ref 11.5–15.5)
WBC: 7.6 10*3/uL (ref 4.0–10.5)

## 2012-02-26 MED ORDER — METOPROLOL SUCCINATE ER 50 MG PO TB24
50.0000 mg | ORAL_TABLET | Freq: Every day | ORAL | Status: DC
  Administered 2012-02-26 – 2012-02-27 (×2): 50 mg via ORAL
  Filled 2012-02-26 (×2): qty 1

## 2012-02-26 MED ORDER — ASPIRIN 81 MG PO CHEW
81.0000 mg | CHEWABLE_TABLET | Freq: Every day | ORAL | Status: DC
  Administered 2012-02-27: 81 mg via ORAL
  Filled 2012-02-26: qty 1

## 2012-02-26 MED ORDER — FUROSEMIDE 80 MG PO TABS
120.0000 mg | ORAL_TABLET | Freq: Once | ORAL | Status: AC
  Administered 2012-02-26: 120 mg via ORAL
  Filled 2012-02-26: qty 1

## 2012-02-26 NOTE — Progress Notes (Signed)
*  PRELIMINARY RESULTS* Echocardiogram 2D Echocardiogram has been performed.  Ian Bentley 02/26/2012, 2:03 PM

## 2012-02-26 NOTE — Progress Notes (Signed)
[facility] PROGRESS NOTE  LEJEND DALBY JXB:[id number] DOB: 30-Sep-1965 DOA: 02/24/2012 PCP: Oliver Barre, MD  Assessment/Plan: Acute CHF -Awaiting echocardiogram results -If ejection fraction is less than 40%, plan to start ACE inhibitor if renal function can tolerate Accelerated hypertension -Patient previously on benazepril prior to lost followup -Switch metoprolol tartrate to metoprolol succinate Atypical chest pain -Acute coronary syndrome ruled out -Change aspirin 81 mg daily   Procedures/Studies: Dg Chest 2 View  02/24/2012  *RADIOLOGY REPORT*  Clinical Data: Chest pain.  Shortness of breath.  CHEST - 2 VIEW  Comparison: None.  Findings: Cardiac silhouette mildly to moderately enlarged. Pulmonary venous hypertension and mild interstitial pulmonary edema as evidenced by Charyl Dancer B lines.  No confluent airspace consolidation.  No pleural effusions.  Degenerative changes involving the thoracic spine.  IMPRESSION: Mild CHF, with mild to moderate cardiomegaly and mild interstitial pulmonary edema.   Original Report Authenticated By: Arnell Sieving, M.D.       Code Status: Full  Disposition Plan: Home when medically stable  Subjective: Patient is feeling much better. He states his breathing is 75% better. He denies any chest pain, shortness breath, nausea, vomiting, diarrhea, abdominal pain, headaches.  Objective: Filed Vitals:   02/25/12 2103 02/26/12 0523 02/26/12 1030 02/26/12 1420  BP: 140/96 137/97 121/89 119/85  Pulse: 90 82 89 82  Temp: 98.7 F (37.1 C) 98.2 F (36.8 C) 98.2 F (36.8 C) 98.5 F (36.9 C)  TempSrc: Oral Oral Oral Oral  Resp: 19 18 20 19   Height:      Weight:  108.909 kg (240 lb 1.6 oz)    SpO2: 96% 97% 100% 98%    Intake/Output Summary (Last 24 hours) at 02/26/12 2021 Last data filed at 02/26/12 1828  Gross per 24 hour  Intake    540 ml  Output    325 ml  Net    215 ml   Weight change: -3.13 kg (-6 lb 14.4 oz) Exam:   General:   Pt is alert, follows commands appropriately, not in acute distress  HEENT: No icterus, No thrush, No neck mass, [place]/AT  Cardiovascular: Regular rate and rhythm, S1/S2, no rubs, no gallops  Respiratory: Clear to auscultation bilaterally, no wheezing, no crackles, no rhonchi  Abdomen: Soft, non tender, non distended, bowel sounds present, no guarding  Extremities: Trace edema, No lymphangitis, No petechiae, No rashes, no synovitis  Data Reviewed: Basic Metabolic Panel:  Lab 02/26/12 1308 02/25/12 1756 02/25/12 0820 02/25/12 0100 02/24/12 1420  NA 144 139 -- 141 142  K 3.8 3.8 -- 3.0* 3.7  CL 105 99 -- 104 105  CO2 30 29 -- 29 28  GLUCOSE 99 107* -- 103* 101*  BUN 21 19 -- 19 20  CREATININE 1.70* 1.96* -- 1.61* 1.65*  CALCIUM 8.9 9.3 -- 8.8 9.0  MG -- -- 2.1 -- --  PHOS -- -- -- -- --   Liver Function Tests:  Lab 02/25/12 0100  AST 29  ALT 60*  ALKPHOS 59  BILITOT 1.2  PROT 5.8*  ALBUMIN 3.3*   No results found for this basename: LIPASE:5,AMYLASE:5 in the last 168 hours No results found for this basename: AMMONIA:5 in the last 168 hours CBC:  Lab 02/26/12 0625 02/25/12 0100 02/24/12 1420  WBC 7.6 10.9* 9.8  NEUTROABS -- -- 8.0*  HGB 14.6 13.4 14.3  HCT 43.9 39.9 42.7  MCV 79.5 78.9 78.6  PLT 170 159 187   Cardiac Enzymes:  Lab 02/25/12 0816 02/25/12 0100 02/24/12  1905 02/24/12 1728 02/24/12 1420  CKTOTAL -- -- -- 192 --  CKMB -- -- -- 3.1 --  CKMBINDEX -- -- -- -- --  TROPONINI <0.30 <0.30 <0.30 -- <0.30   BNP: No components found with this basename: POCBNP:5 CBG: No results found for this basename: GLUCAP:5 in the last 168 hours  No results found for this or any previous visit (from the past 240 hour(s)).   Scheduled Meds:   . aspirin EC  325 mg Oral Daily  . furosemide  120 mg Oral Once  . heparin  5,000 Units Subcutaneous Q8H  . metoprolol tartrate  50 mg Oral BID  . sodium chloride  3 mL Intravenous Q12H  . DISCONTD: furosemide  60 mg Intravenous  Daily   Continuous Infusions:    Kyiah Canepa, DO  [facility] Pager [phone]  If 7PM-7AM, please contact night-coverage [email or web address] Password [email or web address] 02/26/2012, 8:21 PM   LOS: 2 days

## 2012-02-26 NOTE — Progress Notes (Signed)
Utilization Review Completed.Ian Bentley T9/09/2011

## 2012-02-27 ENCOUNTER — Encounter (HOSPITAL_COMMUNITY): Payer: Self-pay | Admitting: Physician Assistant

## 2012-02-27 DIAGNOSIS — J811 Chronic pulmonary edema: Secondary | ICD-10-CM

## 2012-02-27 DIAGNOSIS — I509 Heart failure, unspecified: Secondary | ICD-10-CM

## 2012-02-27 DIAGNOSIS — I5021 Acute systolic (congestive) heart failure: Principal | ICD-10-CM

## 2012-02-27 LAB — BASIC METABOLIC PANEL
CO2: 31 mEq/L (ref 19–32)
Calcium: 9.1 mg/dL (ref 8.4–10.5)
Creatinine, Ser: 1.79 mg/dL — ABNORMAL HIGH (ref 0.50–1.35)
GFR calc Af Amer: 50 mL/min — ABNORMAL LOW (ref >90)
GFR calc non Af Amer: 43 mL/min — ABNORMAL LOW (ref >90)
Sodium: 139 mEq/L (ref 135–145)

## 2012-02-27 LAB — MAGNESIUM: Magnesium: 2.2 mg/dL (ref 1.5–2.5)

## 2012-02-27 MED ORDER — FUROSEMIDE 20 MG PO TABS: 20.0000 mg | ORAL_TABLET | Freq: Every day | ORAL | Status: DC

## 2012-02-27 MED ORDER — SPIRONOLACTONE 12.5 MG HALF TABLET: 12.5000 mg | ORAL_TABLET | Freq: Every day | ORAL | Status: DC

## 2012-02-27 MED ORDER — LISINOPRIL 10 MG PO TABS: 10.0000 mg | ORAL_TABLET | Freq: Every day | ORAL | Status: DC

## 2012-02-27 MED ORDER — ASPIRIN 81 MG PO CHEW: 81.0000 mg | CHEWABLE_TABLET | Freq: Every day | ORAL | Status: DC

## 2012-02-27 MED ORDER — FUROSEMIDE 40 MG PO TABS: 40.0000 mg | ORAL_TABLET | Freq: Every day | ORAL | Status: DC

## 2012-02-27 MED ORDER — SPIRONOLACTONE 12.5 MG HALF TABLET
12.5000 mg | ORAL_TABLET | Freq: Every day | ORAL | Status: DC
  Administered 2012-02-27: 12.5 mg via ORAL
  Filled 2012-02-27: qty 1

## 2012-02-27 MED ORDER — METOPROLOL SUCCINATE ER 50 MG PO TB24: 50.0000 mg | ORAL_TABLET | Freq: Every day | ORAL | Status: DC

## 2012-02-27 MED ORDER — LISINOPRIL 10 MG PO TABS
10.0000 mg | ORAL_TABLET | Freq: Every day | ORAL | Status: DC
  Administered 2012-02-27: 10 mg via ORAL
  Filled 2012-02-27: qty 1

## 2012-02-27 MED ORDER — FUROSEMIDE 20 MG PO TABS
20.0000 mg | ORAL_TABLET | Freq: Every day | ORAL | Status: DC
  Administered 2012-02-27: 20 mg via ORAL
  Filled 2012-02-27: qty 1

## 2012-02-27 NOTE — Consult Note (Signed)
CARDIOLOGY CONSULT NOTE  Patient ID: Ian Bentley, MRN: [id number], DOB/AGE: 1965-11-02 46 y.o. Admit date: 02/24/2012   Date of Consult: 02/27/2012 Primary Physician: Oliver Barre, MD Primary Cardiologist: New  Chief Complaint: shortness of breath Reason for Consult: newly recognized cardiomyopathy (EF 15-20%)  HPI: Mr. Ian Bentley is a 46 y/o M with no prior cardiac history but a history of hypertension who presented to urgent care with complaints of progressive chest pain/SOB and was diagnosed with newly recognized congestive heart failure. 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 He has not seen a doctor for anything since that time. He started experiencing SOB/DOE, coughing and occasional chest discomfort around Dec 2012. 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 His symptoms continued to worsen with exertion over the last 10 months. He reports "spells" of chest pain, SOB, diaphoresis, nausea, lightheadedness which would prompt him to go sit down at which time the symptoms would alleviate after about 10-15 minutes.During some of these episodes, he would not be able to finish conversations. This would resolve with rest and may not occur again that day. He denies having experienced any one major event of chest pain in the months preceding onset. He has had worsening PND/orthopnea over time. No obvious LEE. He denies any syncope. Has had vertigo lasting less than a minute no more than twice a year without presyncope. Denies alcohol or drug use.  He had an episode 02/24/12 of severe substernal chest pain "like an elephant sitting on my chest" with nausea, SOB, and pain radiating to his neck thus went to urgent  care. He was subsequently referred to the ER where BP was 168/120 on arrival. CXR was consistent with mild CHF and mild-moderate cardiomegaly. Initial BNP 6630 and cardiac enzymes have been negative x 4. UDS negative and TSH WNL. 2D echo demonstrated severe LV dilitation with EF 15-20%, mild MR. Cr has also been elevated of uncertain chronicity, is 1.79 today (peak 1.96 two days prior, was 1.3 in 2011). He has been diuresed with IV Lasix 60mg  9/2, IV 60mg  9/3, 120mg  po 9/4 and has since been transitioned to Lasix 20mg  po as of today. Wt down 247->238 with -4L net diuresis. He was also written to start ACEI and spironolactone today. He denies current chest pain and dyspnea. "I feel better than I've felt in a long time now that they got the fluid off."  He has also had 4 brief episodes NSVT on tele, up to 11beats but was asymptomatic.  Past Medical History  Diagnosis Date  . Hypertension 2007  . High cholesterol 02/25/2012  . CHF (congestive heart failure) 02/24/2012    EF 15-20%  . Bronchitis     "when I was a kid"  . Depression     Previously treated with Citalopram 20mg       Most Recent Cardiac Studies: 2D echo 02/26/12 THIS ADMISSION Study Conclusions - Left ventricle: The cavity size was severely dilated. Wall thickness was normal. Systolic function was severely reduced. The estimated ejection fraction was in the range of 15% to 20%. - Mitral valve: Mild regurgitation. - Left atrium: The atrium was mildly dilated. - Right ventricle: The cavity size was mildly dilated.  Systolic function was mildly reduced. - Right atrium: The atrium was mildly dilated.    Surgical History:  Past Surgical History  Procedure Date  . No past surgeries      Home Meds: Prior to Admission medications   Not on File    Inpatient Medications:   . aspirin  81 mg Oral Daily  . furosemide  120 mg Oral Once  . furosemide  20 mg Oral Daily  . heparin  5,000 Units Subcutaneous Q8H  . lisinopril  10 mg Oral  Daily  . metoprolol succinate  50 mg Oral Daily  . sodium chloride  3 mL Intravenous Q12H  . spironolactone  12.5 mg Oral Daily   Allergies: No Known Allergies  History   Social History  . Marital Status: Married    Spouse Name: Elwin Tsou    Number of Children: 1  . Years of Education: 15   Occupational History  . BUS DRIVER [employer]   Social History Main Topics  . Smoking status: Never Smoker   . Smokeless tobacco: Never Used  . Alcohol Use: No     has never been a drinker  . Drug Use: No     has never used drugs  . Sexually Active: Yes -- Male partner(s)     Married   Other Topics Concern  . Not on file   Social History Narrative   Lives with wife and mother. Has one daughter, 23. Has 1 step-son, 30's, and 1 step-daughter, 30's. Wife has extensive psychiatric history including schizophrenia/biploar. Does not know his father. Has no information about his paternal family history. Has no siblings.     Family History  Problem Relation Age of Onset  . Hypertension Mother 20  . Stroke Mother 91  . Cataracts Mother 60    surgical intervention  . Hypertension Daughter   . Heart disease      Pt unsure, possibly in mother's side  . Sudden death Neg Hx      Review of Systems: General: negative for chills, fever, night sweats Cardiovascular: see above Dermatological: negative for rash Respiratory: Positive for cough and wheezing. Urologic: negative for hematuria Abdominal: Positive for nausea. Negative for vomiting, diarrhea, bright red blood per rectum, melena, or hematemesis Neurologic: negative for visual changes, syncope.  All other systems reviewed and are otherwise negative except as noted above.  Labs:  [facility] 02/25/12 0816 02/25/12 0100 02/24/12 1905 02/24/12 1728 02/24/12 1420  CKTOTAL -- -- -- 192 --  CKMB -- -- -- 3.1 --  TROPONINI <0.30 <0.30 <0.30 -- <0.30   Lab Results  Component Value Date   WBC 7.6 02/26/2012   HGB 14.6  02/26/2012   HCT 43.9 02/26/2012   MCV 79.5 02/26/2012   PLT 170 02/26/2012     Lab 02/27/12 0545 02/25/12 0100  NA 139 --  K 3.8 --  CL 101 --  CO2 31 --  BUN 25* --  CREATININE 1.79* --  CALCIUM 9.1 --  PROT -- 5.8*  BILITOT -- 1.2  ALKPHOS -- 59  ALT -- 60*  AST -- 29  GLUCOSE 87 --   Lab Results  Component Value Date   CHOL 133 02/25/2012   HDL 38* 02/25/2012   LDLCALC 76 02/25/2012   TRIG 94 02/25/2012    Radiology/Studies:  1. Dg Chest 2 View 02/24/2012  *RADIOLOGY REPORT*  Clinical Data: Chest pain.  Shortness of breath.  CHEST - 2 VIEW  Comparison: None.  Findings: Cardiac  silhouette mildly to moderately enlarged. Pulmonary venous hypertension and mild interstitial pulmonary edema as evidenced by Charyl Dancer B lines.  No confluent airspace consolidation.  No pleural effusions.  Degenerative changes involving the thoracic spine.  IMPRESSION: Mild CHF, with mild to moderate cardiomegaly and mild interstitial pulmonary edema.   Original Report Authenticated By: Arnell Sieving, M.D.   2. See above AV:WUJW  Tele: 4 episodes of NSVT 5-11 beats. EKG: 01/25/12 NSR, LVH, with anterior T wave changes and TW inversion V5, biphasic V6, T wave flattening otherwise diffusely  Physical Exam: Blood pressure 124/87, pulse 81, temperature 97.6 F (36.4 C), temperature source Oral, resp. rate 16, height 5\' 10"  (1.778 m), weight 238 lb 3.2 oz (108.047 kg), SpO2 98.00%. General: Well developed, overweight male in no acute distress. Head: Normocephalic, atraumatic, sclera non-icteric, PERRLA, no xanthomas, nares are without discharge. Dentition - fair. Oral membranes without pallor and moist.   Neck: Supple without thyromegaly. Negative for carotid bruits. JVD not elevated. Lungs: Clear bilaterally to auscultation without wheezes, rales, or rhonchi. Breathing is unlabored with adequate expansion. Heart: RRR with S1 S2 +S4. No murmurs, rubs, or gallops appreciated. Abdomen: Soft, non-tender, non-distended  with normoactive bowel sounds. No rebound/guarding. No obvious abdominal masses. Msk:  Strength and tone appear normal for age. Extremities: No clubbing or cyanosis. No edema.  Distal pedal pulses are 2+ and equal bilaterally. Neuro: Alert and oriented X 3. Moves all extremities spontaneously. Psych:  Responds to questions appropriately with a normal affect.   Assessment and Plan:  1. Acute systolic congestive heart failure - newly diagnosed with EF 15-20%. Discussed with Dr. Antoine Poche. The patient is eager to be discharged and he is euvolemic. Would continue med rx with Lasix, spironolactone and ACEI for now but check BMET in our office on Monday 03/02/12. Continue BB. Will likely need cath at some point but Dr. Antoine Poche would like him to f/u in office first to demonstrate stable renal function. Although Cr 1.79, his CrCl is 67mL/min. Continue ASA. Can also rx SL NTG at discharge. Pt educated regarding his new diagnosis. He seems motivated. Will need eval for ICD placement if EF remains low after 3 months despite med rx. 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 Would advise him not to drive for now. 2. Hypertension - continue current regimen. Counseled patient on importance of compliance. 3. H/o hyperlipidemia - lipids reasonable. If he does have evidence of CAD by ischemic testing down the road, would recommend statin therapy. 4. Renal insufficiency - uncertain chronicity. May be related to uncontrolled HTN but may require further workup as directed by IM. Follow with new addition of meds. CrCl is >60. 5. Sleep disorder - ?possible OSA, sleep study may be needed. 6. NSVT - continue BB. See above.  Signed, Dayna Dunn PA-C 02/27/2012, 12:16 PM   History and all data above reviewed.  Patient examined.  I agree with the findings as above.  Progressive dyspnea over many months.  No history of chest pain.  He does have HTN which he reports as being very poorly controlled  since he stopped his medications.  EF 15% by echo with global hypokinesis.  The patient exam reveals COR:RRR, positive S4, no S3  ,  Lungs: Clear  ,  Abd: Positive bowel sounds, no rebound no guarding, Ext No edema  .  All available labs, radiology testing, previous records reviewed. Agree with documented assessment and plan. Dilated cardiomyopathy.  I suspect secondary  to HTN.  I agree with current management and plans per Dr. Arbutus Leas.  He will need a right and left heart cath in the future although he would like to defer this.  I think that this is reasonable. We will see him in follow up within the week and we will check a BMET prior to this as he is just starting the lisinopril and spironolactone.  He has renal insufficiency again probably related to HTN.  Fayrene Fearing Laquanta Hummel  1:36 PM  02/27/2012

## 2012-02-27 NOTE — Discharge Summary (Signed)
Physician Discharge Summary  Ian Bentley BMW:[id number] DOB: [birth year] DOA: 02/24/2012  PCP: Ian Barre, MD  Admit date: 02/24/2012 Discharge date: 02/27/2012  Recommendations for Outpatient Follow-up:  1. Pt will need to follow up with PCP in 2-3 weeks post discharge 2. Please obtain BMP to evaluate electrolytes and kidney function 3. Please also check CBC to evaluate Hg and Hct levels 4. Blood work at [facility] September 9 5. Cardiology appointment September 11  Discharge Diagnoses:  Acute systolic CHF  -Clinically improved, stable  -Echocardiogram shows ejection fraction 15-20%  -Start patient on lisinopril, spironolactone  -Continue low-dose furosemide, metoprolol succinate  -Monitor creatinine, lites Cardiomyopathy  -Newly diagnosed, suspect etiology may be ischemic  -Consult cardiology for possible additional workup while in patient  -May ultimately need AICD, and heart catheterization Accelerated hypertension  -Start lisinopril, continue metoprolol succinate  Atypical chest pain  -Acute coronary syndrome ruled out  -Change aspirin 81 mg daily    Discharge Condition: Stable  Disposition:  Follow-up Information    Follow up with [facility]. (Labwork on Monday 03/02/12 to check kidney function and electrolytes - can come anytime between 8:30am and 4:30pm)    Contact information:   [address] [place] [address] [phone]      Follow up with Ian Newcomer, PA. (03/04/12 at 12:10pm)    Contact information:   [facility] [address] [place] [address] [phone]       Follow up with Ian Barre, MD in 2 weeks.   Contact information:   [address] [facility] [address] [place] [address] [place] [address] [phone]          Diet: Cardiac Wt Readings from Last 3 Encounters:  02/27/12 108.047 kg (238 lb 3.2 oz)  02/24/12 115.395 kg (254 lb 6.4 oz)  08/04/09  115.327 kg (254 lb 4 oz)    History of present illness:  46 year old African American male noncompliant with hypertensive medications presents with progressive dyspnea since January. He also has complaints of orthopnea and a dry cough. Patient also complained of a few months duration of chest discomfort. The combination of the entire symptom complex combined with worsening dyspnea prompted his emergency department visit.  Hospital Course:  The patient was admitted to the hospital with pulmonary edema on his chest x-ray with a working diagnosis of acute congestive heart failure. His initial proBNP was 6630. The patient was treated with intravenous furosemide with significant improvement of his symptoms. He was subsequently transitioned to oral furosemide and tolerated the therapy well. In total, the patient diuresed 4284 cc during his hospitalization. Echocardiogram was performed and revealed an ejection fraction of 15-20% with a dilated RV and mild mitral regurgitation. Based upon these results, the patient was started on lisinopril, spironolactone. He was transitioned to metoprolol succinate prior to discharge. Surgical none meds and EKGs were negative for acute coronary syndrome. Lipids revealed total cholesterol 133, LDL 76, HDL 38, triglycerides 94. TSH was 1.824. Urine toxicology was negative. The patient did have some renal insufficiency with a creatinine of 1.65 on the day of admission. His creatinine remained stable at 1.79 on the day of discharge.  Because of his cardiomyopathy, cardiology was consulted for possible further intervention. They offered the patient a left and right heart catheterization, but the patient preferred this to be done at a later time. Given the patient's clinical stability, cardiology thought it was okay to discharge the patient. The  patient has followup with the cardiologist on 03/04/2012.  Consultants: [facility]  Discharge Exam: Filed Vitals:   02/27/12  1440  BP: 113/77  Pulse: 74  Temp: 98.5 F (36.9 C)  Resp: 18   Filed Vitals:   02/26/12 2137 02/27/12 0434 02/27/12 1018 02/27/12 1440  BP: 131/94 129/83 124/87 113/77  Pulse: 93 81 81 74  Temp: 98.2 F (36.8 C) 97.7 F (36.5 C) 97.6 F (36.4 C) 98.5 F (36.9 C)  TempSrc: Oral Oral Oral Oral  Resp: 20 20 16 18   Height:      Weight:  108.047 kg (238 lb 3.2 oz)    SpO2: 96% 98% 98% 98%   General: A&O x 3, NAD, pleasant, cooperative Cardiovascular: RRR, no rub, no gallop, no S3 Respiratory: CTAB, no wheeze, no rhonchi Abdomen:soft, nontender, nondistended, positive bowel sounds  Discharge Instructions  Discharge Orders    Future Appointments: Provider: Department: Dept Phone: Center:   03/02/2012 11:50 AM [facility] [phone] [email or web address]   03/04/2012 12:10 PM Ian Lecher, PA [facility] [phone] [facility]     Future Orders Please Complete By Expires   Diet - low sodium heart healthy      Increase activity slowly        Medication List  As of 02/27/2012  5:34 PM   TAKE these medications         aspirin 81 MG chewable tablet   Chew 1 tablet (81 mg total) by mouth daily.      furosemide 20 MG tablet   Commonly known as: LASIX   Take 1 tablet (20 mg total) by mouth daily.      lisinopril 10 MG tablet   Commonly known as: PRINIVIL,ZESTRIL   Take 1 tablet (10 mg total) by mouth daily.      metoprolol succinate 50 MG 24 hr tablet   Commonly known as: TOPROL-XL   Take 1 tablet (50 mg total) by mouth daily. Take with or immediately following a meal.      spironolactone 12.5 mg Tabs   Commonly known as: ALDACTONE   Take 0.5 tablets (12.5 mg total) by mouth daily.             The results of significant diagnostics from this hospitalization (including imaging, microbiology, ancillary and laboratory) are listed below for reference.    Significant Diagnostic Studies: Dg Chest 2 View  02/24/2012  *RADIOLOGY REPORT*   Clinical Data: Chest pain.  Shortness of breath.  CHEST - 2 VIEW  Comparison: None.  Findings: Cardiac silhouette mildly to moderately enlarged. Pulmonary venous hypertension and mild interstitial pulmonary edema as evidenced by Charyl Dancer B lines.  No confluent airspace consolidation.  No pleural effusions.  Degenerative changes involving the thoracic spine.  IMPRESSION: Mild CHF, with mild to moderate cardiomegaly and mild interstitial pulmonary edema.   Original Report Authenticated By: Arnell Sieving, M.D.      Microbiology: No results found for this or any previous visit (from the past 240 hour(s)).   Labs: Basic Metabolic Panel:  Lab 02/27/12 2956 02/27/12 0545 02/26/12 0625 02/25/12 1756 02/25/12 0820 02/25/12 0100 02/24/12 1420  NA -- 139 144 139 -- 141 142  K -- 3.8 3.8 -- -- -- --  CL -- 101 105 99 -- 104 105  CO2 -- 31 30 29  -- 29 28  GLUCOSE -- 87 99 107* -- 103* 101*  BUN -- 25* 21 19 -- 19 20  CREATININE -- 1.79* 1.70* 1.96* --  1.61* 1.65*  CALCIUM -- 9.1 8.9 9.3 -- 8.8 9.0  MG 2.2 -- -- -- 2.1 -- --  PHOS -- -- -- -- -- -- --   Liver Function Tests:  Lab 02/25/12 0100  AST 29  ALT 60*  ALKPHOS 59  BILITOT 1.2  PROT 5.8*  ALBUMIN 3.3*   No results found for this basename: LIPASE:5,AMYLASE:5 in the last 168 hours No results found for this basename: AMMONIA:5 in the last 168 hours CBC:  Lab 02/26/12 0625 02/25/12 0100 02/24/12 1420  WBC 7.6 10.9* 9.8  NEUTROABS -- -- 8.0*  HGB 14.6 13.4 14.3  HCT 43.9 39.9 42.7  MCV 79.5 78.9 78.6  PLT 170 159 187   Cardiac Enzymes:  Lab 02/25/12 0816 02/25/12 0100 02/24/12 1905 02/24/12 1728 02/24/12 1420  CKTOTAL -- -- -- 192 --  CKMB -- -- -- 3.1 --  CKMBINDEX -- -- -- -- --  TROPONINI <0.30 <0.30 <0.30 -- <0.30   BNP: No components found with this basename: POCBNP:5 CBG: No results found for this basename: GLUCAP:5 in the last 168 hours  Time coordinating discharge:  Greater than 30 minutes  Signed:  Chinmayi Rumer,  Zalayah Pizzuto, DO [facility] Pager: [phone] 02/27/2012, 5:34 PM

## 2012-02-27 NOTE — Care Management Note (Signed)
    Page 1 of 1   02/27/2012     3:22:49 PM   CARE MANAGEMENT NOTE 02/27/2012  Patient:  Ian Bentley, Ian Bentley   Account Number:  [id number]  Date Initiated:  02/27/2012  Documentation initiated by:  Tera Mater  Subjective/Objective Assessment:   46yo male admitted with CHF.  Pt. lives with spouse at home     Action/Plan:   Discharge planning   Anticipated DC Date:  02/28/2012   Anticipated DC Plan:  HOME W HOME HEALTH SERVICES      DC Planning Services  CM consult      [facility] Choice  HOME HEALTH   Choice offered to / List presented to:  C-1 Patient           Status of service:  In process, will continue to follow Medicare Important Message given?   (If response is "NO", the following Medicare IM given date fields will be blank) Date Medicare IM given:   Date Additional Medicare IM given:    Discharge Disposition:    Per UR Regulation:  Reviewed for med. necessity/level of care/duration of stay  If discussed at Long Length of Stay Meetings, dates discussed:    Comments:  02/27/12 1100 Tera Mater, RN, BSN NCM [phone] In to completed HF Screen.  Spoke with pt. about [facility] services as well as giving pt. list of HH agenices.  Pt. stated he would like to consider this.  NCM also spoke to pt. about Living Better with Heart Failure booklet and pt. stated he does weigh himself everyday, and is able to obtain his medications, however he is not compliant with taking them sometimes.  Encouraged pt. to take medications as directed.  NCM will follow for discharge needs.

## 2012-02-27 NOTE — Progress Notes (Signed)
[facility] PROGRESS NOTE  Ian Bentley WUJ:[id number] DOB: 07-03-1965 DOA: 02/24/2012 PCP: Oliver Barre, MD  Assessment/Plan: Acute systolic CHF  -Clinically improved, stable -Echocardiogram shows ejection fraction 15-20% -Start patient on lisinopril, spironolactone -Continue low-dose furosemide, metoprolol succinate -Monitor creatinine Cardiomyopathy -Newly diagnosed, suspect etiology may be ischemic -Consult cardiology for possible additional workup while in patient -May ultimately need AICD Accelerated hypertension  -Start lisinopril, continue metoprolol succinate  Atypical chest pain  -Acute coronary syndrome ruled out  -Change aspirin 81 mg daily    disposition: Home today if no further workup by cardiology   Procedures/Studies: Dg Chest 2 View  02/24/2012  *RADIOLOGY REPORT*  Clinical Data: Chest pain.  Shortness of breath.  CHEST - 2 VIEW  Comparison: None.  Findings: Cardiac silhouette mildly to moderately enlarged. Pulmonary venous hypertension and mild interstitial pulmonary edema as evidenced by Charyl Dancer B lines.  No confluent airspace consolidation.  No pleural effusions.  Degenerative changes involving the thoracic spine.  IMPRESSION: Mild CHF, with mild to moderate cardiomegaly and mild interstitial pulmonary edema.   Original Report Authenticated By: Arnell Sieving, M.D.        Subjective: Patient is feeling well at this time; he denies any chest pain, shortness breath, nausea, vomiting, orthopnea, PND, abdominal pain, rashes, fevers, chills.  Objective: Filed Vitals:   02/26/12 1030 02/26/12 1420 02/26/12 2137 02/27/12 0434  BP: 121/89 119/85 131/94 129/83  Pulse: 89 82 93 81  Temp: 98.2 F (36.8 C) 98.5 F (36.9 C) 98.2 F (36.8 C) 97.7 F (36.5 C)  TempSrc: Oral Oral Oral Oral  Resp: 20 19 20 20   Height:      Weight:    108.047 kg (238 lb 3.2 oz)  SpO2: 100% 98% 96% 98%    Intake/Output Summary (Last 24 hours) at 02/27/12 0756 Last data  filed at 02/26/12 1828  Gross per 24 hour  Intake    480 ml  Output    325 ml  Net    155 ml   Weight change: -0.862 kg (-1 lb 14.4 oz) Exam:   General:  Pt is alert, follows commands appropriately, not in acute distress  HEENT: No icterus, No thrush,  [place]/AT  Cardiovascular: Regular rate and rhythm, S1/S2, no rubs, no gallops  Respiratory: Clear to auscultation bilaterally, no wheezing, no crackles, no rhonchi  Abdomen: Soft, non tender, non distended, bowel sounds present, no guarding  Extremities: No edema, No lymphangitis, No petechiae, No rashes, no synovitis  Data Reviewed: Basic Metabolic Panel:  Lab 02/27/12 9562 02/26/12 0625 02/25/12 1756 02/25/12 0820 02/25/12 0100 02/24/12 1420  NA 139 144 139 -- 141 142  K 3.8 3.8 3.8 -- 3.0* 3.7  CL 101 105 99 -- 104 105  CO2 31 30 29  -- 29 28  GLUCOSE 87 99 107* -- 103* 101*  BUN 25* 21 19 -- 19 20  CREATININE 1.79* 1.70* 1.96* -- 1.61* 1.65*  CALCIUM 9.1 8.9 9.3 -- 8.8 9.0  MG -- -- -- 2.1 -- --  PHOS -- -- -- -- -- --   Liver Function Tests:  Lab 02/25/12 0100  AST 29  ALT 60*  ALKPHOS 59  BILITOT 1.2  PROT 5.8*  ALBUMIN 3.3*   No results found for this basename: LIPASE:5,AMYLASE:5 in the last 168 hours No results found for this basename: AMMONIA:5 in the last 168 hours CBC:  Lab 02/26/12 0625 02/25/12 0100 02/24/12 1420  WBC 7.6 10.9* 9.8  NEUTROABS -- -- 8.0*  HGB 14.6  13.4 14.3  HCT 43.9 39.9 42.7  MCV 79.5 78.9 78.6  PLT 170 159 187   Cardiac Enzymes:  Lab 02/25/12 0816 02/25/12 0100 02/24/12 1905 02/24/12 1728 02/24/12 1420  CKTOTAL -- -- -- 192 --  CKMB -- -- -- 3.1 --  CKMBINDEX -- -- -- -- --  TROPONINI <0.30 <0.30 <0.30 -- <0.30   BNP: No components found with this basename: POCBNP:5 CBG: No results found for this basename: GLUCAP:5 in the last 168 hours  No results found for this or any previous visit (from the past 240 hour(s)).   Scheduled Meds:   . aspirin  81 mg Oral Daily  .  furosemide  120 mg Oral Once  . furosemide  20 mg Oral Daily  . heparin  5,000 Units Subcutaneous Q8H  . lisinopril  10 mg Oral Daily  . metoprolol succinate  50 mg Oral Daily  . sodium chloride  3 mL Intravenous Q12H  . spironolactone  12.5 mg Oral Daily  . DISCONTD: aspirin EC  325 mg Oral Daily  . DISCONTD: furosemide  60 mg Intravenous Daily  . DISCONTD: furosemide  40 mg Oral Daily  . DISCONTD: metoprolol tartrate  50 mg Oral BID   Continuous Infusions:    Theoren Palka, DO  [facility] Pager [phone]  If 7PM-7AM, please contact night-coverage [email or web address] Password [email or web address] 02/27/2012, 7:56 AM   LOS: 3 days

## 2012-02-28 ENCOUNTER — Telehealth: Payer: Self-pay | Admitting: Pharmacist

## 2012-02-28 NOTE — Telephone Encounter (Signed)
Spoke with pt's wife as pain is not in.  Wife says pt is OK.  Reviewed appt times with wife who will let pt know.  He will call back if questions.

## 2012-02-28 NOTE — Telephone Encounter (Signed)
Message copied by Velda Shell on Fri Feb 28, 2012 10:25 AM ------      Message from: Laurann [place]      Created: Thu Feb 27, 2012 12:59 PM      Regarding: Transition of Care       Per Dr. Antoine Poche, Mr. Gronau will need to be a transition of care pt. He is coming in on Monday 9/9 for a BMET and has appt with Lorin Picket 03/04/12. New systolic CHF. Thanks!

## 2012-03-02 ENCOUNTER — Ambulatory Visit (INDEPENDENT_AMBULATORY_CARE_PROVIDER_SITE_OTHER): Payer: BC Managed Care – PPO | Admitting: *Deleted

## 2012-03-02 DIAGNOSIS — I5021 Acute systolic (congestive) heart failure: Secondary | ICD-10-CM

## 2012-03-02 LAB — BASIC METABOLIC PANEL
Calcium: 8.8 mg/dL (ref 8.4–10.5)
Creatinine, Ser: 1.6 mg/dL — ABNORMAL HIGH (ref 0.4–1.5)
GFR: 58.65 mL/min — ABNORMAL LOW (ref >60.00)
Sodium: 138 mEq/L (ref 135–145)

## 2012-03-03 ENCOUNTER — Telehealth: Payer: Self-pay | Admitting: *Deleted

## 2012-03-03 NOTE — Telephone Encounter (Signed)
Message copied by Tarri Fuller on Tue Mar 03, 2012 12:43 PM ------      Message from: [place], [place] T      Created: Mon Mar 02, 2012  5:19 PM       Creatinine stable      Keep follow up as planned.      Tereso Newcomer, PA-C  5:19 PM 03/02/2012

## 2012-03-03 NOTE — Telephone Encounter (Signed)
pt notified of lab results today with verbal understanding. 

## 2012-03-04 ENCOUNTER — Ambulatory Visit (INDEPENDENT_AMBULATORY_CARE_PROVIDER_SITE_OTHER): Payer: BC Managed Care – PPO | Admitting: Physician Assistant

## 2012-03-04 ENCOUNTER — Encounter: Payer: Self-pay | Admitting: Physician Assistant

## 2012-03-04 ENCOUNTER — Encounter: Payer: Self-pay | Admitting: *Deleted

## 2012-03-04 VITALS — BP 122/92 | HR 78 | Ht 70.0 in | Wt 248.4 lb

## 2012-03-04 DIAGNOSIS — I5022 Chronic systolic (congestive) heart failure: Secondary | ICD-10-CM

## 2012-03-04 DIAGNOSIS — I10 Essential (primary) hypertension: Secondary | ICD-10-CM

## 2012-03-04 DIAGNOSIS — I428 Other cardiomyopathies: Secondary | ICD-10-CM

## 2012-03-04 DIAGNOSIS — I429 Cardiomyopathy, unspecified: Secondary | ICD-10-CM

## 2012-03-04 MED ORDER — METOPROLOL SUCCINATE ER 50 MG PO TB24: 50.0000 mg | ORAL_TABLET | Freq: Every day | ORAL | Status: DC

## 2012-03-04 NOTE — Patient Instructions (Addendum)
Your physician has recommended you make the following change in your medication: INCREASE TOPROL TO 75 MG X 3 DAYS THEN INCREASE TO 100 MG DAILY, PLEASE CALL OFFICE IF NOT ABLE TO TOLERATE THE 75 MG INCREASE AND IF YOU CAN THEN LET ME KNOW SO I CAN SEND IN A NEW PRESCRIPTION FOR 100 MG TABLET.  YOU HAVE BEEN GIVEN A WORK NOTE STATING THAT YOU ARE TO BE OUT OF WORK FOR 3 MONTHS PER [employer], [facility]  Your physician recommends that you schedule a follow-up appointment in: 03/19/12 @ 11:10 AM WITH [place], PAC

## 2012-03-04 NOTE — Progress Notes (Addendum)
[address] [place], [place]  [address] Phone: [phone] Fax:  [phone]  Date:  03/04/2012   Name:  Ian Bentley   DOB:  Aug 12, 1965   MRN:  [id number]  PCP:  Oliver Barre, MD  Primary Cardiologist:  Dr. Rollene Rotunda  Primary Electrophysiologist:  None    History of Present Illness: Ian Bentley is a 46 y.o. male who returns for post hospital followup. He has a history of HTN. He ran out of his blood pressure medications in 2/12. He was admitted to the hospital 9/2-9/5 with acute systolic CHF. EF was 15-20% by echocardiogram. He ruled out for myocardial infarction by enzymes. His creatinine was noted to be elevated. He was seen by Dr. Antoine Poche. He suggested proceeding with right and left heart catheterization in the near future. The patient wanted to defer this and this was felt to be reasonable. His CHF medications were adjusted.  He was noted to have NSVT on tele.  Today he is feeling better.  The patient denies chest pain, shortness of breath, syncope, orthopnea, PND or significant pedal edema.    Wt Readings from Last 3 Encounters:  03/04/12 248 lb 6.4 oz (112.674 kg)  02/27/12 238 lb 3.2 oz (108.047 kg)  02/24/12 254 lb 6.4 oz (115.395 kg)     Past Medical History  Diagnosis Date  . Hypertension 2007  . High cholesterol 02/25/2012  . Depression     Previously treated with Citalopram 20mg   . Cardiomyopathy     a. Echo 9/13: severe LV dilation, normal wall thickness, EF 15-20%, mild MR, mild LAE, mild RVE, mild reduced RV fxn, mild RAE  . Chronic systolic heart failure     a. admx 9/13 (new dx)  . CKD (chronic kidney disease) stage 3, GFR 30-59 ml/min     Creatinine (9/13) 1.6 =>  . NSVT (nonsustained ventricular tachycardia)     Current Outpatient Prescriptions  Medication Sig Dispense Refill  . aspirin 81 MG chewable tablet Chew 1 tablet (81 mg total) by mouth daily.  30 tablet  6  . furosemide (LASIX) 20 MG tablet Take 1 tablet (20  mg total) by mouth daily.  30 tablet  3  . lisinopril (PRINIVIL,ZESTRIL) 10 MG tablet Take 1 tablet (10 mg total) by mouth daily.  30 tablet  3  . metoprolol succinate (TOPROL-XL) 50 MG 24 hr tablet Take 1 tablet (50 mg total) by mouth daily. Take with or immediately following a meal.  30 tablet  3  . spironolactone (ALDACTONE) 12.5 mg TABS Take 0.5 tablets (12.5 mg total) by mouth daily.  30 tablet  3    Allergies: No Known Allergies  History  Substance Use Topics  . Smoking status: Never Smoker   . Smokeless tobacco: Never Used  . Alcohol Use: No     has never been a drinker     ROS:  Please see the history of present illness.    All other systems reviewed and negative.   PHYSICAL EXAM: VS:  BP 122/92  Pulse 78  Ht 5\' 10"  (1.778 m)  Wt 248 lb 6.4 oz (112.674 kg)  BMI 35.64 kg/m2 Well nourished, well developed, in no acute distress HEENT: normal Neck: no JVD Cardiac:  normal S1, S2; RRR; no murmur Lungs:  clear to auscultation bilaterally, no wheezing, rhonchi or rales Abd: soft, nontender, no hepatomegaly Ext: no edema Skin: warm and dry Neuro:  CNs 2-12 intact, no focal abnormalities noted  EKG:  NSR, HR 78, normal axis, PRWP, LVH, no change from prior tracing      ASSESSMENT AND PLAN:  1. Chronic Systolic CHF:  Volume stable.  Weights up by our scales, but volume looks good.  Advance Rx by increasing Toprol XL to 100 mg QD.  Plan follow up in 2 weeks.  2. Cardiomyopathy:  Discussed role of right and left heart cath.  Risks and benefits of cardiac catheterization have been discussed with the patient.  These include bleeding, infection, kidney damage, stroke, heart attack, death.  The patient understands these risks and is willing to proceed. He has baseline renal insufficiency.  I will discuss this further with Dr. Rollene Rotunda. If he agrees to proceed, I will contact the patient and arrange.  I have provided him a not to remain out of work for 3 mos. (he is a school  bus driver).    3. Hypertension:  Better controlled.  Management coincides with treatment of his CHF.    4. Chronic Kidney Disease:  Likely related to hypertensive nephrosclerosis.  Continue to follow renal fxn and K+ periodically.  Creatinine, Ser  Date/Time Value Range Status  03/02/2012 11:38 AM 1.6* 0.4 - 1.5 mg/dL Final  06/29/1094  0:45 AM 1.79* 0.50 - 1.35 mg/dL Final  4/0/9811  9:14 AM 1.70* 0.50 - 1.35 mg/dL Final    Signed, Tereso Newcomer, PA-C  12:30 PM 03/04/2012

## 2012-03-10 ENCOUNTER — Telehealth: Payer: Self-pay | Admitting: Physician Assistant

## 2012-03-10 NOTE — Telephone Encounter (Signed)
Ian Bentley I spoke to Dr. Rollene Rotunda. He suggested we set him up for cardiac cath. This can be done in the [facility] lab with Dr. Antoine Poche. Will plan on doing a radial case and limit dye load. He will need to come in a few hours early to start hydration.   He sees me 9/26.  We could schedule for after that visit and then we can draw labs, give instructions, etc that day. Thanks Tereso Newcomer, PA-C  2:17 PM 03/10/2012

## 2012-03-13 NOTE — Telephone Encounter (Signed)
See note

## 2012-03-19 ENCOUNTER — Encounter: Payer: Self-pay | Admitting: *Deleted

## 2012-03-19 ENCOUNTER — Ambulatory Visit (INDEPENDENT_AMBULATORY_CARE_PROVIDER_SITE_OTHER): Payer: BC Managed Care – PPO | Admitting: Physician Assistant

## 2012-03-19 ENCOUNTER — Encounter: Payer: Self-pay | Admitting: Physician Assistant

## 2012-03-19 VITALS — BP 144/104 | HR 89 | Ht 70.0 in | Wt 253.4 lb

## 2012-03-19 DIAGNOSIS — I5022 Chronic systolic (congestive) heart failure: Secondary | ICD-10-CM

## 2012-03-19 DIAGNOSIS — I10 Essential (primary) hypertension: Secondary | ICD-10-CM

## 2012-03-19 DIAGNOSIS — I429 Cardiomyopathy, unspecified: Secondary | ICD-10-CM

## 2012-03-19 LAB — BASIC METABOLIC PANEL
CO2: 29 mEq/L (ref 19–32)
Calcium: 9.1 mg/dL (ref 8.4–10.5)
Chloride: 102 mEq/L (ref 96–112)
Sodium: 139 mEq/L (ref 135–145)

## 2012-03-19 LAB — CBC WITH DIFFERENTIAL/PLATELET
Basophils Relative: 1 % (ref 0.0–3.0)
Eosinophils Absolute: 0.2 10*3/uL (ref 0.0–0.7)
Eosinophils Relative: 3.4 % (ref 0.0–5.0)
Hemoglobin: 14.3 g/dL (ref 13.0–17.0)
Lymphocytes Relative: 24.3 % (ref 12.0–46.0)
MCHC: 31.5 g/dL (ref 30.0–36.0)
Neutro Abs: 3.6 10*3/uL (ref 1.4–7.7)
RBC: 5.52 Mil/uL (ref 4.22–5.81)
WBC: 5.7 10*3/uL (ref 4.5–10.5)

## 2012-03-19 MED ORDER — METOPROLOL SUCCINATE ER 100 MG PO TB24: 100.0000 mg | ORAL_TABLET | Freq: Every day | ORAL | Status: DC

## 2012-03-19 NOTE — Patient Instructions (Addendum)
Your physician has requested that you have a cardiac catheterization 03/25/12 WITH DR. HOCHREIN. Cardiac catheterization is used to diagnose and/or treat various heart conditions. Doctors may recommend this procedure for a number of different reasons. The most common reason is to evaluate chest pain. Chest pain can be a symptom of coronary artery disease (CAD), and cardiac catheterization can show whether plaque is narrowing or blocking your heart's arteries. This procedure is also used to evaluate the valves, as well as measure the blood flow and oxygen levels in different parts of your heart. For further information please visit [email or web address] Please follow instruction sheet, as given.  Your physician recommends that you return for lab work in: TODAY PRE CATH LABS, BMET, CBC W/DIFF, PT/INR   PER SCOTT WEAVER, PAC MAKE SURE TO TAKE YOUR MEDICATIONS TODAY WHEN YOU GET HOME 03/19/12

## 2012-03-19 NOTE — H&P (Signed)
History and Physical   Date:  03/19/2012   Name:  Ian Bentley   DOB:  [birth year]   MRN:  [id number]  PCP:  Oliver Barre, MD  Primary Cardiologist:  Dr. Rollene Rotunda  Primary Electrophysiologist:  None    History of Present Illness: Ian Bentley is a 46 y.o. male who returns for followup. He has a history of HTN. He ran out of his blood pressure medications in 2/12. He was admitted to the hospital earlier this month with new onset, acute systolic CHF. EF was 15-20% by echocardiogram. He ruled out for myocardial infarction by enzymes. His creatinine was noted to be elevated. I saw him in followup 03/04/12. I adjusted his medications by increasing his Toprol. I reviewed his case with Dr. Antoine Poche. He suggested setting up the patient for cardiac catheterization. He would try to use a radial approach and minimal dye. Patient would need to present a few hours early to obtain hydration.  Patient ran out of Toprol 48 hours ago.  BP high today.  Medication has been refilled.  The patient denies chest pain, shortness of breath, syncope, orthopnea, PND or significant pedal edema.    Wt Readings from Last 3 Encounters:  03/19/12 253 lb 6.4 oz (114.941 kg)  03/04/12 248 lb 6.4 oz (112.674 kg)  02/27/12 238 lb 3.2 oz (108.047 kg)     Past Medical History  Diagnosis Date  . Hypertension 2007  . High cholesterol 02/25/2012  . Depression     Previously treated with Citalopram 20mg   . Cardiomyopathy     a. Echo 9/13: severe LV dilation, normal wall thickness, EF 15-20%, mild MR, mild LAE, mild RVE, mild reduced RV fxn, mild RAE  . Chronic systolic heart failure     a. admx 9/13 (new dx)  . CKD (chronic kidney disease) stage 3, GFR 30-59 ml/min     Creatinine (9/13) 1.6 =>  . NSVT (nonsustained ventricular tachycardia)     Current Outpatient Prescriptions  Medication Sig Dispense Refill  . aspirin 81 MG chewable tablet Chew 1 tablet (81 mg total) by mouth daily.  30 tablet  6  . furosemide  (LASIX) 20 MG tablet Take 1 tablet (20 mg total) by mouth daily.  30 tablet  3  . lisinopril (PRINIVIL,ZESTRIL) 10 MG tablet Take 1 tablet (10 mg total) by mouth daily.  30 tablet  3  . metoprolol succinate (TOPROL-XL) 100 MG 24 hr tablet Take 1 tablet (100 mg total) by mouth daily. Take with or immediately following a meal.  30 tablet  5  . spironolactone (ALDACTONE) 12.5 mg TABS Take 0.5 tablets (12.5 mg total) by mouth daily.  30 tablet  3    Allergies: No Known Allergies  History  Substance Use Topics  . Smoking status: Never Smoker   . Smokeless tobacco: Never Used  . Alcohol Use: No     has never been a drinker     Family History  Problem Relation Age of Onset  . Hypertension Mother 51  . Stroke Mother 64  . Cataracts Mother 44    surgical intervention  . Hypertension Daughter   . Heart disease      Pt unsure, possibly in mother's side  . Sudden death Neg Hx     ROS:  Please see the history of present illness.    All other systems reviewed and negative.   PHYSICAL EXAM: VS:  BP 144/104  Pulse 89  Ht 5\' 10"  (1.778 m)  Wt 253 lb 6.4 oz (114.941 kg)  BMI 36.36 kg/m2  SpO2 98% Well nourished, well developed, in no acute distress HEENT: normal Neck: no JVD Cardiac:  normal S1, S2; RRR; no murmur Lungs:  clear to auscultation bilaterally, no wheezing, rhonchi or rales Abd: soft, nontender, no hepatomegaly Ext: no edema Skin: warm and dry Neuro:  CNs 2-12 intact, no focal abnormalities noted   ASSESSMENT AND PLAN:  1. Chronic Systolic CHF:  Volume stable.  BP high because he is out of his Toprol and has not taken any medications today.  Hold off on any further medication titration for now.  Plan diagnostic cardiac cath as noted with Dr. Rollene Rotunda.  He will likely need higher dosages of ACE inhibitor.  Could consider changing Toprol to Carvedilol.  Also, could consider hydralazine and nitrates in the future if BP difficult to control.  2. Cardiomyopathy:   Discussed role of left heart cath with patient at last visit.  Risks and benefits of cardiac catheterization have been discussed with the patient.  These include bleeding, infection, kidney damage, stroke, heart attack, death.  The patient understands these risks and is willing to proceed. He has baseline renal insufficiency.  I did discuss this further with Dr. Rollene Rotunda.  He will be brought in early for hydration prior to cath.  Will attempt right radial approach and limit dye load.  3. Hypertension:  Management coincides with treatment of his CHF.    4. Chronic Kidney Disease:  Likely related to hypertensive nephrosclerosis.  Continue to follow renal fxn and K+ periodically.  Creatinine, Ser  Date/Time Value Range Status  03/19/2012 12:31 PM 1.4  0.4 - 1.5 mg/dL Final  06/29/1094 04:54 AM 1.6* 0.4 - 1.5 mg/dL Final  0/02/8118  1:47 AM 1.79* 0.50 - 1.35 mg/dL Final    Luna Glasgow, PA-C  11:53 AM 03/19/2012

## 2012-03-19 NOTE — Progress Notes (Signed)
[address] [place], [place]  [address] Phone: [phone] Fax:  [phone]  Date:  03/19/2012   Name:  Ian Bentley   DOB:  12/27/1965   MRN:  [id number]  PCP:  James John, MD  Primary Cardiologist:  Dr. James Hochrein  Primary Electrophysiologist:  None    History of Present Illness: Ian Bentley is a 46 y.o. male who returns for followup. He has a history of HTN. He ran out of his blood pressure medications in 2/12. He was admitted to the hospital earlier this month with new onset, acute systolic CHF. EF was 15-20% by echocardiogram. He ruled out for myocardial infarction by enzymes. His creatinine was noted to be elevated. I saw him in followup 03/04/12. I adjusted his medications by increasing his Toprol. I reviewed his case with Dr. Hochrein. He suggested setting up the patient for cardiac catheterization. He would try to use a radial approach and minimal dye. Patient would need to present a few hours early to obtain hydration.  Patient ran out of Toprol 48 hours ago.  BP high today.  Medication has been refilled.  The patient denies chest pain, shortness of breath, syncope, orthopnea, PND or significant pedal edema.    Wt Readings from Last 3 Encounters:  03/19/12 253 lb 6.4 oz (114.941 kg)  03/04/12 248 lb 6.4 oz (112.674 kg)  02/27/12 238 lb 3.2 oz (108.047 kg)     Past Medical History  Diagnosis Date  . Hypertension 2007  . High cholesterol 02/25/2012  . Depression     Previously treated with Citalopram 20mg  . Cardiomyopathy     a. Echo 9/13: severe LV dilation, normal wall thickness, EF 15-20%, mild MR, mild LAE, mild RVE, mild reduced RV fxn, mild RAE  . Chronic systolic heart failure     a. admx 9/13 (new dx)  . CKD (chronic kidney disease) stage 3, GFR 30-59 ml/min     Creatinine (9/13) 1.6 =>  . NSVT (nonsustained ventricular tachycardia)     Current Outpatient Prescriptions  Medication Sig Dispense Refill  . aspirin 81 MG  chewable tablet Chew 1 tablet (81 mg total) by mouth daily.  30 tablet  6  . furosemide (LASIX) 20 MG tablet Take 1 tablet (20 mg total) by mouth daily.  30 tablet  3  . lisinopril (PRINIVIL,ZESTRIL) 10 MG tablet Take 1 tablet (10 mg total) by mouth daily.  30 tablet  3  . metoprolol succinate (TOPROL-XL) 100 MG 24 hr tablet Take 1 tablet (100 mg total) by mouth daily. Take with or immediately following a meal.  30 tablet  5  . spironolactone (ALDACTONE) 12.5 mg TABS Take 0.5 tablets (12.5 mg total) by mouth daily.  30 tablet  3    Allergies: No Known Allergies  History  Substance Use Topics  . Smoking status: Never Smoker   . Smokeless tobacco: Never Used  . Alcohol Use: No     has never been a drinker     Family History  Problem Relation Age of Onset  . Hypertension Mother 50  . Stroke Mother 23  . Cataracts Mother 65    surgical intervention  . Hypertension Daughter   . Heart disease      Pt unsure, possibly in mother's side  . Sudden death Neg Hx     ROS:  Please see the history of present illness.    All other systems reviewed and negative.   PHYSICAL EXAM: VS:    BP 144/104  Pulse 89  Ht 5' 10" (1.778 m)  Wt 253 lb 6.4 oz (114.941 kg)  BMI 36.36 kg/m2  SpO2 98% Well nourished, well developed, in no acute distress HEENT: normal Neck: no JVD Cardiac:  normal S1, S2; RRR; no murmur Lungs:  clear to auscultation bilaterally, no wheezing, rhonchi or rales Abd: soft, nontender, no hepatomegaly Ext: no edema Skin: warm and dry Neuro:  CNs 2-12 intact, no focal abnormalities noted   ASSESSMENT AND PLAN:  1. Chronic Systolic CHF:  Volume stable.  BP high because he is out of his Toprol and has not taken any medications today.  Hold off on any further medication titration for now.  Plan diagnostic cardiac cath as noted with Dr. James Hochrein.  He will likely need higher dosages of ACE inhibitor.  Could consider changing Toprol to Carvedilol.  Also, could consider  hydralazine and nitrates in the future if BP difficult to control.  2. Cardiomyopathy:  Discussed role of left heart cath with patient at last visit.  Risks and benefits of cardiac catheterization have been discussed with the patient.  These include bleeding, infection, kidney damage, stroke, heart attack, death.  The patient understands these risks and is willing to proceed. He has baseline renal insufficiency.  I did discuss this further with Dr. James Hochrein.  He will be brought in early for hydration prior to cath.  Will attempt right radial approach and limit dye load.  3. Hypertension:  Management coincides with treatment of his CHF.    4. Chronic Kidney Disease:  Likely related to hypertensive nephrosclerosis.  Continue to follow renal fxn and K+ periodically.  Creatinine, Ser  Date/Time Value Range Status  03/19/2012 12:31 PM 1.4  0.4 - 1.5 mg/dL Final  03/02/2012 11:38 AM 1.6* 0.4 - 1.5 mg/dL Final  02/27/2012  5:45 AM 1.79* 0.50 - 1.35 mg/dL Final    Signed, Missael Ferrari, PA-C  11:53 AM 03/19/2012    

## 2012-03-20 ENCOUNTER — Inpatient Hospital Stay (HOSPITAL_BASED_OUTPATIENT_CLINIC_OR_DEPARTMENT_OTHER): Admission: RE | Admit: 2012-03-20 | Payer: BC Managed Care – PPO | Source: Ambulatory Visit | Admitting: Cardiology

## 2012-03-20 ENCOUNTER — Telehealth: Payer: Self-pay | Admitting: *Deleted

## 2012-03-20 NOTE — Telephone Encounter (Signed)
lmom pre cath labs ok

## 2012-03-20 NOTE — Telephone Encounter (Signed)
Message copied by Tarri Fuller on Fri Mar 20, 2012 10:00 AM ------      Message from: [place], [place] T      Created: Thu Mar 19, 2012  5:39 PM       Labs good for cath      Tereso Newcomer, PA-C  5:39 PM 03/19/2012

## 2012-03-22 ENCOUNTER — Telehealth: Payer: Self-pay | Admitting: Cardiology

## 2012-03-22 NOTE — Telephone Encounter (Signed)
Done

## 2012-03-25 ENCOUNTER — Encounter (HOSPITAL_BASED_OUTPATIENT_CLINIC_OR_DEPARTMENT_OTHER): Admission: RE | Payer: Self-pay | Source: Ambulatory Visit

## 2012-03-25 ENCOUNTER — Inpatient Hospital Stay (HOSPITAL_BASED_OUTPATIENT_CLINIC_OR_DEPARTMENT_OTHER)
Admission: RE | Admit: 2012-03-25 | Discharge: 2012-03-25 | Disposition: A | Discharge status: Home / Self Care | Payer: BC Managed Care – PPO | Source: Ambulatory Visit | Attending: Cardiology | Admitting: Cardiology

## 2012-03-25 ENCOUNTER — Encounter (HOSPITAL_BASED_OUTPATIENT_CLINIC_OR_DEPARTMENT_OTHER)
Admission: RE | Disposition: A | Discharge status: Home / Self Care | Payer: Self-pay | Source: Ambulatory Visit | Attending: Cardiology

## 2012-03-25 ENCOUNTER — Encounter (HOSPITAL_BASED_OUTPATIENT_CLINIC_OR_DEPARTMENT_OTHER): Payer: Self-pay | Admitting: *Deleted

## 2012-03-25 DIAGNOSIS — N183 Chronic kidney disease, stage 3 unspecified: Secondary | ICD-10-CM | POA: Insufficient documentation

## 2012-03-25 DIAGNOSIS — I129 Hypertensive chronic kidney disease with stage 1 through stage 4 chronic kidney disease, or unspecified chronic kidney disease: Secondary | ICD-10-CM | POA: Insufficient documentation

## 2012-03-25 DIAGNOSIS — I428 Other cardiomyopathies: Secondary | ICD-10-CM | POA: Insufficient documentation

## 2012-03-25 DIAGNOSIS — I429 Cardiomyopathy, unspecified: Secondary | ICD-10-CM

## 2012-03-25 DIAGNOSIS — I5022 Chronic systolic (congestive) heart failure: Secondary | ICD-10-CM | POA: Insufficient documentation

## 2012-03-25 DIAGNOSIS — I509 Heart failure, unspecified: Secondary | ICD-10-CM | POA: Insufficient documentation

## 2012-03-25 SURGERY — JV LEFT HEART CATHETERIZATION WITH CORONARY ANGIOGRAM
Anesthesia: Moderate Sedation

## 2012-03-25 MED ORDER — SODIUM CHLORIDE 0.9 % IV SOLN: INTRAVENOUS | Status: AC

## 2012-03-25 MED ORDER — SODIUM CHLORIDE 0.9 % IJ SOLN: 3.0000 mL | Freq: Two times a day (BID) | INTRAMUSCULAR | Status: DC

## 2012-03-25 MED ORDER — SODIUM CHLORIDE 0.9 % IJ SOLN: 3.0000 mL | INTRAMUSCULAR | Status: DC | PRN

## 2012-03-25 MED ORDER — SODIUM CHLORIDE 0.9 % IV SOLN
INTRAVENOUS | Status: DC
  Administered 2012-03-25: 07:00:00 via INTRAVENOUS

## 2012-03-25 MED ORDER — ACETAMINOPHEN 325 MG PO TABS: 650.0000 mg | ORAL_TABLET | ORAL | Status: DC | PRN

## 2012-03-25 MED ORDER — ASPIRIN 81 MG PO CHEW
324.0000 mg | CHEWABLE_TABLET | ORAL | Status: AC
  Administered 2012-03-25: 243 mg via ORAL

## 2012-03-25 MED ORDER — SODIUM CHLORIDE 0.9 % IV SOLN: 250.0000 mL | INTRAVENOUS | Status: DC | PRN

## 2012-03-25 MED ORDER — ONDANSETRON HCL 4 MG/2ML IJ SOLN: 4.0000 mg | Freq: Four times a day (QID) | INTRAMUSCULAR | Status: DC | PRN

## 2012-03-25 NOTE — CV Procedure (Signed)
   Cardiac Catheterization Procedure Note  Name: Ian Bentley MRN: [id number] DOB: 22-Apr-1966  Procedure: Left Heart Cath, Selective Coronary Angiography, LV angiography  Indication:    Procedural details: The right radial was prepped, draped, and anesthetized with 1% lidocaine. Using modified Seldinger technique, a 5 French sheath was introduced into the right radial artery. Standard Judkins catheters were used for coronary angiography and left ventriculography. Catheter exchanges were performed over a guidewire. There were no immediate procedural complications. The patient was transferred to the post catheterization recovery area for further monitoring.  Procedural Findings:   Hemodynamics:     AO 117/96    LV 118/25   Coronary angiography:   Coronary dominance: Right  Left mainstem:   Left main  Left anterior descending (LAD):   Normal.  Wraps the apex.  Small diagonals x 3 normal.  Left circumflex (LCx):  AV groove small and normal.  Large RI normal.  OM small and normal.    Right coronary artery (RCA):  Large and normal.  PDA large and normal.  PL large and normal.  Left ventriculography:   Severe global hypkinesis.  LVEF is estimated at 25%, there is no significant mitral regurgitation   Final Conclusions:    Normal coronary arteries.  Severe global cardiomyopathy.  Recommendations:   Continue medical management.  Rollene Rotunda 03/25/2012, 8:38 AM

## 2012-03-25 NOTE — H&P (View-Only) (Signed)
[address] [place], [place]  [address] Phone: [phone] Fax:  [phone]  Date:  03/19/2012   Name:  Ian Bentley   DOB:  [birth year]   MRN:  [id number]  PCP:  Oliver Barre, MD  Primary Cardiologist:  Dr. Rollene Rotunda  Primary Electrophysiologist:  None    History of Present Illness: Ian Bentley is a 46 y.o. male who returns for followup. He has a history of HTN. He ran out of his blood pressure medications in 2/12. He was admitted to the hospital earlier this month with new onset, acute systolic CHF. EF was 15-20% by echocardiogram. He ruled out for myocardial infarction by enzymes. His creatinine was noted to be elevated. I saw him in followup 03/04/12. I adjusted his medications by increasing his Toprol. I reviewed his case with Dr. Antoine Poche. He suggested setting up the patient for cardiac catheterization. He would try to use a radial approach and minimal dye. Patient would need to present a few hours early to obtain hydration.  Patient ran out of Toprol 48 hours ago.  BP high today.  Medication has been refilled.  The patient denies chest pain, shortness of breath, syncope, orthopnea, PND or significant pedal edema.    Wt Readings from Last 3 Encounters:  03/19/12 253 lb 6.4 oz (114.941 kg)  03/04/12 248 lb 6.4 oz (112.674 kg)  02/27/12 238 lb 3.2 oz (108.047 kg)     Past Medical History  Diagnosis Date  . Hypertension 2007  . High cholesterol 02/25/2012  . Depression     Previously treated with Citalopram 20mg   . Cardiomyopathy     a. Echo 9/13: severe LV dilation, normal wall thickness, EF 15-20%, mild MR, mild LAE, mild RVE, mild reduced RV fxn, mild RAE  . Chronic systolic heart failure     a. admx 9/13 (new dx)  . CKD (chronic kidney disease) stage 3, GFR 30-59 ml/min     Creatinine (9/13) 1.6 =>  . NSVT (nonsustained ventricular tachycardia)     Current Outpatient Prescriptions  Medication Sig Dispense Refill  . aspirin 81 MG  chewable tablet Chew 1 tablet (81 mg total) by mouth daily.  30 tablet  6  . furosemide (LASIX) 20 MG tablet Take 1 tablet (20 mg total) by mouth daily.  30 tablet  3  . lisinopril (PRINIVIL,ZESTRIL) 10 MG tablet Take 1 tablet (10 mg total) by mouth daily.  30 tablet  3  . metoprolol succinate (TOPROL-XL) 100 MG 24 hr tablet Take 1 tablet (100 mg total) by mouth daily. Take with or immediately following a meal.  30 tablet  5  . spironolactone (ALDACTONE) 12.5 mg TABS Take 0.5 tablets (12.5 mg total) by mouth daily.  30 tablet  3    Allergies: No Known Allergies  History  Substance Use Topics  . Smoking status: Never Smoker   . Smokeless tobacco: Never Used  . Alcohol Use: No     has never been a drinker     Family History  Problem Relation Age of Onset  . Hypertension Mother 74  . Stroke Mother 43  . Cataracts Mother 62    surgical intervention  . Hypertension Daughter   . Heart disease      Pt unsure, possibly in mother's side  . Sudden death Neg Hx     ROS:  Please see the history of present illness.    All other systems reviewed and negative.   PHYSICAL EXAM: VS:  BP 144/104  Pulse 89  Ht 5\' 10"  (1.778 m)  Wt 253 lb 6.4 oz (114.941 kg)  BMI 36.36 kg/m2  SpO2 98% Well nourished, well developed, in no acute distress HEENT: normal Neck: no JVD Cardiac:  normal S1, S2; RRR; no murmur Lungs:  clear to auscultation bilaterally, no wheezing, rhonchi or rales Abd: soft, nontender, no hepatomegaly Ext: no edema Skin: warm and dry Neuro:  CNs 2-12 intact, no focal abnormalities noted   ASSESSMENT AND PLAN:  1. Chronic Systolic CHF:  Volume stable.  BP high because he is out of his Toprol and has not taken any medications today.  Hold off on any further medication titration for now.  Plan diagnostic cardiac cath as noted with Dr. Rollene Rotunda.  He will likely need higher dosages of ACE inhibitor.  Could consider changing Toprol to Carvedilol.  Also, could consider  hydralazine and nitrates in the future if BP difficult to control.  2. Cardiomyopathy:  Discussed role of left heart cath with patient at last visit.  Risks and benefits of cardiac catheterization have been discussed with the patient.  These include bleeding, infection, kidney damage, stroke, heart attack, death.  The patient understands these risks and is willing to proceed. He has baseline renal insufficiency.  I did discuss this further with Dr. Rollene Rotunda.  He will be brought in early for hydration prior to cath.  Will attempt right radial approach and limit dye load.  3. Hypertension:  Management coincides with treatment of his CHF.    4. Chronic Kidney Disease:  Likely related to hypertensive nephrosclerosis.  Continue to follow renal fxn and K+ periodically.  Creatinine, Ser  Date/Time Value Range Status  03/19/2012 12:31 PM 1.4  0.4 - 1.5 mg/dL Final  06/29/1094 04:54 AM 1.6* 0.4 - 1.5 mg/dL Final  0/02/8118  1:47 AM 1.79* 0.50 - 1.35 mg/dL Final    Luna Glasgow, PA-C  11:53 AM 03/19/2012

## 2012-03-25 NOTE — OR Nursing (Signed)
Dr Hochrein at bedside to discuss results and treatment plan with pt and family 

## 2012-03-25 NOTE — Interval H&P Note (Signed)
History and Physical Interval Note:  03/25/2012 8:36 AM  Ian Bentley  has presented today for surgery, with the diagnosis of cp  The various methods of treatment have been discussed with the patient and family. After consideration of risks, benefits and other options for treatment, the patient has consented to  Procedure(s) (LRB) with comments: JV LEFT HEART CATHETERIZATION WITH CORONARY ANGIOGRAM (N/A) as a surgical intervention .  The patient's history has been reviewed, patient examined, no change in status, stable for surgery.  I have reviewed the patient's chart and labs.  Questions were answered to the patient's satisfaction.     Ian Bentley

## 2012-03-26 ENCOUNTER — Encounter (HOSPITAL_BASED_OUTPATIENT_CLINIC_OR_DEPARTMENT_OTHER): Payer: Self-pay

## 2012-04-01 ENCOUNTER — Ambulatory Visit (INDEPENDENT_AMBULATORY_CARE_PROVIDER_SITE_OTHER): Payer: BC Managed Care – PPO | Admitting: *Deleted

## 2012-04-01 DIAGNOSIS — I10 Essential (primary) hypertension: Secondary | ICD-10-CM

## 2012-04-01 LAB — BASIC METABOLIC PANEL
BUN: 17 mg/dL (ref 6–23)
Chloride: 108 mEq/L (ref 96–112)
Creatinine, Ser: 1.5 mg/dL (ref 0.4–1.5)
Glucose, Bld: 94 mg/dL (ref 70–99)
Potassium: 4.2 mEq/L (ref 3.5–5.1)

## 2012-04-08 ENCOUNTER — Ambulatory Visit (INDEPENDENT_AMBULATORY_CARE_PROVIDER_SITE_OTHER): Payer: BC Managed Care – PPO | Admitting: Physician Assistant

## 2012-04-08 ENCOUNTER — Telehealth: Payer: Self-pay | Admitting: *Deleted

## 2012-04-08 ENCOUNTER — Encounter: Payer: Self-pay | Admitting: Physician Assistant

## 2012-04-08 VITALS — BP 156/100 | HR 62 | Ht 70.0 in | Wt 258.1 lb

## 2012-04-08 DIAGNOSIS — I5022 Chronic systolic (congestive) heart failure: Secondary | ICD-10-CM

## 2012-04-08 DIAGNOSIS — I10 Essential (primary) hypertension: Secondary | ICD-10-CM

## 2012-04-08 DIAGNOSIS — N183 Chronic kidney disease, stage 3 unspecified: Secondary | ICD-10-CM

## 2012-04-08 DIAGNOSIS — I428 Other cardiomyopathies: Secondary | ICD-10-CM

## 2012-04-08 LAB — BASIC METABOLIC PANEL
CO2: 30 mEq/L (ref 19–32)
Calcium: 9.2 mg/dL (ref 8.4–10.5)
Creatinine, Ser: 1.5 mg/dL (ref 0.4–1.5)
GFR: 65.07 mL/min (ref >60.00)
Glucose, Bld: 93 mg/dL (ref 70–99)
Sodium: 139 mEq/L (ref 135–145)

## 2012-04-08 MED ORDER — CARVEDILOL 12.5 MG PO TABS: 12.5000 mg | ORAL_TABLET | Freq: Two times a day (BID) | ORAL | Status: DC

## 2012-04-08 NOTE — Telephone Encounter (Signed)
Message copied by Tarri Fuller on Wed Apr 08, 2012  5:18 PM ------      Message from: [place], [place] T      Created: Wed Apr 08, 2012  5:03 PM       [facility]      Continue with current treatment plan.      Tereso Newcomer, PA-C  5:03 PM 04/08/2012

## 2012-04-08 NOTE — Patient Instructions (Addendum)
Stop taking Toprol. Start taking Coreg (Carvedilol) 12.5 mg twice daily.  I have sent a prescription to your pharmacy. Increase walking until you are walking 30 minutes every day. Set a goal to lose 10 lbs.  Your physician recommends that you return for lab work in: TODAY BMET   Your physician recommends that you schedule a follow-up appointment in: 04/22/12 @ 8:30 AM WITH Froylan Hobby, [facility]  Your physician recommends that you schedule a follow-up appointment in: 06/12/12 @ 9 AM WITH DR. HOCHREIN    2 Gram Low Sodium Diet A 2 gram sodium diet restricts the amount of sodium in the diet to no more than 2 g or 2000 mg daily. Limiting the amount of sodium is often used to help lower blood pressure. It is important if you have heart, liver, or kidney problems. Many foods contain sodium for flavor and sometimes as a preservative. When the amount of sodium in a diet needs to be low, it is important to know what to look for when choosing foods and drinks. The following includes some information and guidelines to help make it easier for you to adapt to a low sodium diet. QUICK TIPS  Do not add salt to food.  Avoid convenience items and fast food.  Choose unsalted snack foods.  Buy lower sodium products, often labeled as "lower sodium" or "no salt added."  Check food labels to learn how much sodium is in 1 serving.  When eating at a restaurant, ask that your food be prepared with less salt or none, if possible. READING FOOD LABELS FOR SODIUM INFORMATION The nutrition facts label is a good place to find how much sodium is in foods. Look for products with no more than 500 to 600 mg of sodium per meal and no more than 150 mg per serving. Remember that 2 g = 2000 mg. The food label may also list foods as:  Sodium-free: Less than 5 mg in a serving.  Very low sodium: 35 mg or less in a serving.  Low-sodium: 140 mg or less in a serving.  Light in sodium: 50% less sodium in a serving. For  example, if a food that usually has 300 mg of sodium is changed to become light in sodium, it will have 150 mg of sodium.  Reduced sodium: 25% less sodium in a serving. For example, if a food that usually has 400 mg of sodium is changed to reduced sodium, it will have 300 mg of sodium. CHOOSING FOODS Grains  Avoid: Salted crackers and snack items. Some cereals, including instant hot cereals. Bread stuffing and biscuit mixes. Seasoned rice or pasta mixes.  Choose: Unsalted snack items. Low-sodium cereals, oats, puffed wheat and rice, shredded wheat. English muffins and bread. Pasta. Meats  Avoid: Salted, canned, smoked, spiced, pickled meats, including fish and poultry. Bacon, ham, sausage, cold cuts, hot dogs, anchovies.  Choose: Low-sodium canned tuna and salmon. Fresh or frozen meat, poultry, and fish. Dairy  Avoid: Processed cheese and spreads. Cottage cheese. Buttermilk and condensed milk. Regular cheese.  Choose: Milk. Low-sodium cottage cheese. Yogurt. Sour cream. Low-sodium cheese. Fruits and Vegetables  Avoid: Regular canned vegetables. Regular canned tomato sauce and paste. Frozen vegetables in sauces. Olives. Rosita Fire. Relishes. Sauerkraut.  Choose: Low-sodium canned vegetables. Low-sodium tomato sauce and paste. Frozen or fresh vegetables. Fresh and frozen fruit. Condiments  Avoid: Canned and packaged gravies. Worcestershire sauce. Tartar sauce. Barbecue sauce. Soy sauce. Steak sauce. Ketchup. Onion, garlic, and table salt. Meat flavorings and tenderizers.  Choose: Fresh and dried herbs and spices. Low-sodium varieties of mustard and ketchup. Lemon juice. Tabasco sauce. Horseradish. SAMPLE 2 GRAM SODIUM MEAL PLAN Breakfast / Sodium (mg)  1 cup low-fat milk / 143 mg  2 slices whole-wheat toast / 270 mg  1 tbs heart-healthy margarine / 153 mg  1 hard-boiled egg / 139 mg  1 small orange / 0 mg Lunch / Sodium (mg)  1 cup raw carrots / 76 mg   cup hummus / 298  mg  1 cup low-fat milk / 143 mg   cup red grapes / 2 mg  1 whole-wheat pita bread / 356 mg Dinner / Sodium (mg)  1 cup whole-wheat pasta / 2 mg  1 cup low-sodium tomato sauce / 73 mg  3 oz lean ground beef / 57 mg  1 small side salad (1 cup raw spinach leaves,  cup cucumber,  cup yellow bell pepper) with 1 tsp olive oil and 1 tsp red wine vinegar / 25 mg Snack / Sodium (mg)  1 container low-fat vanilla yogurt / 107 mg  3 graham cracker squares / 127 mg Nutrient Analysis  Calories: 2033  Protein: 77 g  Carbohydrate: 282 g  Fat: 72 g  Sodium: 1971 mg Document Released: 06/10/2005 Document Revised: 09/02/2011 Document Reviewed: 09/11/2009 [facility] Patient Information 2013 [place], [place].

## 2012-04-08 NOTE — Progress Notes (Signed)
[address] [place], [place]  [address] Phone: [phone] Fax:  [phone]  Date:  04/08/2012   Name:  Ian Bentley   DOB:  [birth year]   MRN:  [id number]  PCP:  Oliver Barre, MD  Primary Cardiologist:  Dr. Rollene Rotunda  Primary Electrophysiologist:  None    History of Present Illness: Ian Bentley is a 46 y.o. male who returns for followup. He has a hx of HTN, CKD and recently dx cardiomyopathy and systolic CHF. He was admitted in 9/13 and EF was 15-20% by echocardiogram. He ruled out for myocardial infarction by enzymes.  I saw him a couple weeks ago and arranged LHC.  This was done 10/2 and demonstrated normal coronary arteries.  He is doing well.  The patient denies chest pain, shortness of breath, syncope, orthopnea, PND or significant pedal edema.   Labs (9/13):   K 4, creatinine 1.4, LDL 76, Hgb 14.3, TSH 1.824 Labs (10/13): K 4.2, creatinine 1.5   Wt Readings from Last 3 Encounters:  04/08/12 258 lb 1.9 oz (117.082 kg)  03/25/12 253 lb (114.76 kg)  03/25/12 253 lb (114.76 kg)     Past Medical History  Diagnosis Date  . Hypertension 2007  . High cholesterol 02/25/2012  . Depression     Previously treated with Citalopram 20mg   . NICM (nonischemic cardiomyopathy)     a. Echo 9/13: severe LV dilation, normal wall thickness, EF 15-20%, mild MR, mild LAE, mild RVE, mild reduced RV fxn, mild RAE;  b.  LHC 10/13:  normal coronary arteries; EF 25%  . Chronic systolic heart failure     a. admx 9/13 (new dx)  . CKD (chronic kidney disease) stage 3, GFR 30-59 ml/min     Creatinine (9/13) 1.6 =>  . NSVT (nonsustained ventricular tachycardia)     Current Outpatient Prescriptions  Medication Sig Dispense Refill  . aspirin 81 MG chewable tablet Chew 1 tablet (81 mg total) by mouth daily.  30 tablet  6  . furosemide (LASIX) 20 MG tablet Take 1 tablet (20 mg total) by mouth daily.  30 tablet  3  . lisinopril (PRINIVIL,ZESTRIL) 10 MG tablet Take  1 tablet (10 mg total) by mouth daily.  30 tablet  3  . metoprolol succinate (TOPROL-XL) 100 MG 24 hr tablet Take 1 tablet (100 mg total) by mouth daily. Take with or immediately following a meal.  30 tablet  5  . spironolactone (ALDACTONE) 12.5 mg TABS Take 0.5 tablets (12.5 mg total) by mouth daily.  30 tablet  3    Allergies: No Known Allergies  History  Substance Use Topics  . Smoking status: Never Smoker   . Smokeless tobacco: Never Used  . Alcohol Use: No     has never been a drinker     ROS:  Please see the history of present illness.    All other systems reviewed and negative.   PHYSICAL EXAM: VS:  BP 156/100  Pulse 62  Ht 5\' 10"  (1.778 m)  Wt 258 lb 1.9 oz (117.082 kg)  BMI 37.04 kg/m2 Well nourished, well developed, in no acute distress HEENT: normal Neck: no JVD Cardiac:  normal S1, S2; RRR; no murmur Lungs:  clear to auscultation bilaterally, no wheezing, rhonchi or rales Abd: soft, nontender, no hepatomegaly Ext: no edema; right wrist without hematoma or bruit  Skin: warm and dry Neuro:  CNs 2-12 intact, no focal abnormalities noted   ASSESSMENT AND PLAN:  1.  Chronic Systolic CHF:  Volume stable.  Continue current regimen.  Check BMET today.  2. Non-Ischemic Cardiomyopathy:  Likely related to untreated HTN.  I will change his Toprol to Coreg 12.5 mg bid.  We discussed long term plans for follow up.  He will need to have a repeat echo ~ March 2014.  We discussed limiting his salt and losing weight to help his BP.  Follow up with me in 2 weeks and Dr. Rollene Rotunda in 6-8 weeks.  Consider increasing ACE at next visit.    3. Hypertension:  Management coincides with treatment of his CHF.    4. Chronic Kidney Disease:  Likely related to hypertensive nephrosclerosis.  Continue to follow renal fxn and K+ periodically.  Signed, Tereso Newcomer, PA-C  8:40 AM 04/08/2012

## 2012-04-08 NOTE — Telephone Encounter (Signed)
pt notified about lab results, verbalized understanding today 

## 2012-04-22 ENCOUNTER — Encounter: Payer: Self-pay | Admitting: Physician Assistant

## 2012-04-22 ENCOUNTER — Ambulatory Visit (INDEPENDENT_AMBULATORY_CARE_PROVIDER_SITE_OTHER): Payer: BC Managed Care – PPO | Admitting: Physician Assistant

## 2012-04-22 VITALS — BP 140/84 | HR 76 | Ht 70.0 in | Wt 261.8 lb

## 2012-04-22 DIAGNOSIS — I5022 Chronic systolic (congestive) heart failure: Secondary | ICD-10-CM

## 2012-04-22 DIAGNOSIS — N183 Chronic kidney disease, stage 3 unspecified: Secondary | ICD-10-CM

## 2012-04-22 DIAGNOSIS — I428 Other cardiomyopathies: Secondary | ICD-10-CM

## 2012-04-22 DIAGNOSIS — I10 Essential (primary) hypertension: Secondary | ICD-10-CM

## 2012-04-22 MED ORDER — LISINOPRIL 10 MG PO TABS: 10.0000 mg | ORAL_TABLET | Freq: Two times a day (BID) | ORAL | Status: DC

## 2012-04-22 NOTE — Patient Instructions (Addendum)
Increase Lisinopril to 10 mg twice daily.  I have sent in a new prescription to your pharmacy.  Your physician recommends that you return for lab work in: 05/01/12 BMET  KEEP APPOINTMENT WITH DR. HOCHREIN 06/12/12

## 2012-04-22 NOTE — Progress Notes (Signed)
[address], [address] [place], [place]  [address] Phone: [phone]; Fax:  [phone]  Date:  04/22/2012   Name:  Ian Bentley   DOB:  1965-08-17   MRN:  [id number]  PCP:  Oliver Barre, MD  Primary Cardiologist:  Dr. Rollene Rotunda  Primary Electrophysiologist:  None    History of Present Illness: Ian Bentley is a 46 y.o. male who returns for follow up of CHF. He has a hx of HTN, CKD and chronic systolic CHF secondary to non-ischemic cardiomyopathy.  EF is 15-20% by echocardiogram. LHC has demonstrated normal coronary arteries.  He returns for further medication titration.  Last visit, I changed Toprol to Coreg.  The patient denies chest pain, shortness of breath, syncope, orthopnea, PND or significant pedal edema.   Labs (9/13):   K 4, creatinine 1.4, LDL 76, Hgb 14.3, TSH 1.824 Labs (10/13): K 4.2=>3.8, creatinine 1.5=>1.5  Wt Readings from Last 3 Encounters:  04/22/12 261 lb 12.8 oz (118.752 kg)  04/08/12 258 lb 1.9 oz (117.082 kg)  03/25/12 253 lb (114.76 kg)     Past Medical History  Diagnosis Date  . Hypertension 2007  . High cholesterol 02/25/2012  . Depression     Previously treated with Citalopram 20mg   . NICM (nonischemic cardiomyopathy)     a. Echo 9/13: severe LV dilation, normal wall thickness, EF 15-20%, mild MR, mild LAE, mild RVE, mild reduced RV fxn, mild RAE;  b.  LHC 10/13:  normal coronary arteries; EF 25%  . Chronic systolic heart failure     a. admx 9/13 (new dx)  . CKD (chronic kidney disease) stage 3, GFR 30-59 ml/min     Creatinine (9/13) 1.6 =>  . NSVT (nonsustained ventricular tachycardia)     Current Outpatient Prescriptions  Medication Sig Dispense Refill  . aspirin 81 MG chewable tablet Chew 1 tablet (81 mg total) by mouth daily.  30 tablet  6  . carvedilol (COREG) 12.5 MG tablet Take 1 tablet (12.5 mg total) by mouth 2 (two) times daily.  180 tablet  3  . furosemide (LASIX) 20 MG tablet Take 1  tablet (20 mg total) by mouth daily.  30 tablet  3  . lisinopril (PRINIVIL,ZESTRIL) 10 MG tablet Take 1 tablet (10 mg total) by mouth daily.  30 tablet  3  . spironolactone (ALDACTONE) 25 MG tablet Take 12.5 mg by mouth daily.        Allergies:   No Known Allergies  Social History:   The patient  reports that he has never smoked. He has never used smokeless tobacco. He reports that he does not drink alcohol or use illicit drugs.    ROS:  Please see the history of present illness.  All other systems reviewed and negative.   PHYSICAL EXAM: VS:  BP 140/84  Pulse 76  Ht 5\' 10"  (1.778 m)  Wt 261 lb 12.8 oz (118.752 kg)  BMI 37.56 kg/m2 Well nourished, well developed, in no acute distress HEENT: normal Neck: no JVD Cardiac:  normal S1, S2; RRR; no murmur Lungs:  clear to auscultation bilaterally, no wheezing, rhonchi or rales Abd: soft, nontender, no hepatomegaly Ext: no edema  Skin: warm and dry Neuro:  CNs 2-12 intact, no focal abnormalities noted  EKG:   NSR, HR 76, no changes   ASSESSMENT AND PLAN:  1. Chronic Systolic CHF:   Doing well.  Volume stable.  Adjust Lisinopril to 10 mg BID.  Check BMET in one week.  Follow up with Dr. Rollene Rotunda in 05/2012 as planned.     2. Non-Ischemic Cardiomyopathy:   Likely related to untreated HTN.  Medication titration continues as noted.  He will likely need follow up echo ~ 08/2012 to reassess LVF.    3. Hypertension:   Management coincides with treatment of his CHF.    4. Chronic Kidney Disease:   Check BMET in one week.  Luna Glasgow, PA-C  8:49 AM 04/22/2012

## 2012-05-01 ENCOUNTER — Other Ambulatory Visit (INDEPENDENT_AMBULATORY_CARE_PROVIDER_SITE_OTHER): Payer: BC Managed Care – PPO

## 2012-05-01 DIAGNOSIS — I10 Essential (primary) hypertension: Secondary | ICD-10-CM

## 2012-05-01 DIAGNOSIS — I5022 Chronic systolic (congestive) heart failure: Secondary | ICD-10-CM

## 2012-05-01 LAB — BASIC METABOLIC PANEL
BUN: 18 mg/dL (ref 6–23)
Calcium: 9 mg/dL (ref 8.4–10.5)
Chloride: 104 mEq/L (ref 96–112)
Creatinine, Ser: 1.4 mg/dL (ref 0.4–1.5)
GFR: 70.52 mL/min (ref >60.00)

## 2012-06-12 ENCOUNTER — Ambulatory Visit (INDEPENDENT_AMBULATORY_CARE_PROVIDER_SITE_OTHER): Payer: BC Managed Care – PPO | Admitting: Cardiology

## 2012-06-12 ENCOUNTER — Encounter: Payer: Self-pay | Admitting: Cardiology

## 2012-06-12 ENCOUNTER — Encounter: Payer: Self-pay | Admitting: *Deleted

## 2012-06-12 VITALS — BP 120/82 | HR 66 | Ht 70.0 in | Wt 268.8 lb

## 2012-06-12 DIAGNOSIS — I428 Other cardiomyopathies: Secondary | ICD-10-CM

## 2012-06-12 MED ORDER — CARVEDILOL 6.25 MG PO TABS: 6.2500 mg | ORAL_TABLET | Freq: Two times a day (BID) | ORAL | Status: DC

## 2012-06-12 NOTE — Patient Instructions (Addendum)
Please increase Carvedilol to 18.75 mg twice a day.  Take 12.5 mg and 6.25 mg both twice a day. Continue all other medications as listed.  Follow up with Tereso Newcomer, PA in 1 month.

## 2012-06-12 NOTE — Progress Notes (Signed)
   HPI The patient presents for followup of his nonischemic cardiomyopathy. He says he feels quite well. He is not being active. He has gained about 10 pounds from eating too much since October.  The patient denies any new symptoms such as chest discomfort, neck or arm discomfort. There has been no new shortness of breath, PND or orthopnea. There have been no reported palpitations, presyncope or syncope.    No Known Allergies  Current Outpatient Prescriptions  Medication Sig Dispense Refill  . aspirin 81 MG chewable tablet Chew 1 tablet (81 mg total) by mouth daily.  30 tablet  6  . carvedilol (COREG) 12.5 MG tablet Take 1 tablet (12.5 mg total) by mouth 2 (two) times daily.  180 tablet  3  . furosemide (LASIX) 20 MG tablet Take 1 tablet (20 mg total) by mouth daily.  30 tablet  3  . lisinopril (PRINIVIL,ZESTRIL) 10 MG tablet Take 1 tablet (10 mg total) by mouth 2 (two) times daily.  60 tablet  5  . spironolactone (ALDACTONE) 25 MG tablet Take 12.5 mg by mouth daily.        Past Medical History  Diagnosis Date  . Hypertension 2007  . High cholesterol 02/25/2012  . Depression     Previously treated with Citalopram 20mg   . NICM (nonischemic cardiomyopathy)     a. Echo 9/13: severe LV dilation, normal wall thickness, EF 15-20%, mild MR, mild LAE, mild RVE, mild reduced RV fxn, mild RAE;  b.  LHC 10/13:  normal coronary arteries; EF 25%  . Chronic systolic heart failure     a. admx 9/13 (new dx)  . CKD (chronic kidney disease) stage 3, GFR 30-59 ml/min     Creatinine (9/13) 1.6 =>1.5  . NSVT (nonsustained ventricular tachycardia)     Past Surgical History  Procedure Date  . No past surgeries     ROS:  As stated in the HPI and negative for all other systems.  PHYSICAL EXAM BP 120/82  Pulse 66  Ht 5\' 10"  (1.778 m)  Wt 268 lb 12.8 oz (121.927 kg)  BMI 38.57 kg/m2 GENERAL:  Well appearing NECK:  No jugular venous distention, waveform within normal limits, carotid upstroke brisk and  symmetric, no bruits, no thyromegaly LUNGS:  Clear to auscultation bilaterally CHEST:  Unremarkable HEART:  PMI not displaced or sustained,S1 and S2 within normal limits, no S3, no S4, no clicks, no rubs, no murmurs ABD:  Flat, positive bowel sounds normal in frequency in pitch, no bruits, no rebound, no guarding, no midline pulsatile mass, no hepatomegaly, no splenomegaly EXT:  2 plus pulses throughout, no edema, no cyanosis no clubbing   ASSESSMENT AND PLAN  Chronic Systolic CHF:  I will increase the carvedilol to 18.75 mg twice daily. He will otherwise stay on the meds as listed. I reviewed the diagnosis with him using a heart model.  He will get another echocardiogram sometime in the spring but we will be seeing him for this.  He can return to work.   Non-Ischemic Cardiomyopathy: As above.   Hypertension:  This is being managed in the context of treating his CHF  Chronic Kidney Disease:  His last creatinine was 1.4. I reviewed this with him. No change in therapy is indicated.

## 2012-07-10 ENCOUNTER — Encounter: Payer: Self-pay | Admitting: Physician Assistant

## 2012-07-10 ENCOUNTER — Ambulatory Visit: Payer: BC Managed Care – PPO | Admitting: Physician Assistant

## 2012-07-10 ENCOUNTER — Ambulatory Visit (INDEPENDENT_AMBULATORY_CARE_PROVIDER_SITE_OTHER): Payer: BC Managed Care – PPO | Admitting: Physician Assistant

## 2012-07-10 VITALS — BP 148/100 | HR 63 | Ht 70.0 in | Wt 271.8 lb

## 2012-07-10 DIAGNOSIS — I10 Essential (primary) hypertension: Secondary | ICD-10-CM

## 2012-07-10 DIAGNOSIS — I428 Other cardiomyopathies: Secondary | ICD-10-CM

## 2012-07-10 DIAGNOSIS — I5022 Chronic systolic (congestive) heart failure: Secondary | ICD-10-CM

## 2012-07-10 MED ORDER — LISINOPRIL 20 MG PO TABS: 20.0000 mg | ORAL_TABLET | Freq: Two times a day (BID) | ORAL | Status: DC

## 2012-07-10 MED ORDER — FUROSEMIDE 20 MG PO TABS: 20.0000 mg | ORAL_TABLET | Freq: Every day | ORAL | Status: DC

## 2012-07-10 NOTE — Progress Notes (Signed)
[address], [address] [place], [place]  [address] Phone: [phone], Fax:  [phone]  Date:  07/10/2012   ID:  Ian Bentley, Ian Bentley Jan 14, 1966, MRN [id number]  PCP:  Oliver Barre, MD  Primary Cardiologist:  Dr. Rollene Rotunda     History of Present Illness: Ian Bentley is a 47 y.o. male who returns for followup.  He has a hx of HTN, CKD and chronic systolic CHF secondary to non-ischemic cardiomyopathy. EF is 15-20% by echocardiogram. LHC has demonstrated normal coronary arteries. Last seen by Dr. Antoine Poche 06/14/12. Carvedilol was increased at that visit.  Patient is doing well. He did run out of his Lasix recently. He denies any significant weight gain or shortness of breath since that time.  The patient denies chest pain, shortness of breath, syncope, orthopnea, PND or significant pedal edema.   Labs (9/13):    K 4, creatinine 1.4, LDL 76, Hgb 14.3, TSH 1.824  Labs (10/13):  K 4.2=>3.8, creatinine 1.5=>1.5 Labs (11/13):  K 4.1, creatinine 1.4  Wt Readings from Last 3 Encounters:  07/10/12 271 lb 12.8 oz (123.288 kg)  06/12/12 268 lb 12.8 oz (121.927 kg)  04/22/12 261 lb 12.8 oz (118.752 kg)     Past Medical History  Diagnosis Date  . Hypertension 2007  . High cholesterol 02/25/2012  . Depression     Previously treated with Citalopram 20mg   . NICM (nonischemic cardiomyopathy)     a. Echo 9/13: severe LV dilation, normal wall thickness, EF 15-20%, mild MR, mild LAE, mild RVE, mild reduced RV fxn, mild RAE;  b.  LHC 10/13:  normal coronary arteries; EF 25%  . Chronic systolic heart failure     a. admx 9/13 (new dx)  . CKD (chronic kidney disease) stage 3, GFR 30-59 ml/min     Creatinine (9/13) 1.6 =>1.5  . NSVT (nonsustained ventricular tachycardia)     Current Outpatient Prescriptions  Medication Sig Dispense Refill  . aspirin 81 MG chewable tablet Chew 1 tablet (81 mg total) by mouth daily.  30 tablet  6  . carvedilol (COREG) 12.5 MG tablet Take 1  tablet (12.5 mg total) by mouth 2 (two) times daily.  180 tablet  3  . carvedilol (COREG) 6.25 MG tablet Take 1 tablet (6.25 mg total) by mouth 2 (two) times daily.  180 tablet  3  . furosemide (LASIX) 20 MG tablet Take 1 tablet (20 mg total) by mouth daily.  30 tablet  3  . lisinopril (PRINIVIL,ZESTRIL) 10 MG tablet Take 1 tablet (10 mg total) by mouth 2 (two) times daily.  60 tablet  5  . spironolactone (ALDACTONE) 25 MG tablet Take 12.5 mg by mouth daily.        Allergies:   No Known Allergies  Social History:  The patient  reports that he has never smoked. He has never used smokeless tobacco. He reports that he does not drink alcohol or use illicit drugs.   ROS:  Please see the history of present illness.     All other systems reviewed and negative.   PHYSICAL EXAM: VS:  BP 148/100  Pulse 63  Ht 5\' 10"  (1.778 m)  Wt 271 lb 12.8 oz (123.288 kg)  BMI 39.00 kg/m2 Well nourished, well developed, in no acute distress HEENT: normal Neck: no JVD Cardiac:  normal S1, S2; RRR; no murmur Lungs:  clear to auscultation bilaterally, no wheezing, rhonchi or rales Abd: soft, nontender, no hepatomegaly Ext: no edema Skin: warm and  dry Neuro:  CNs 2-12 intact, no focal abnormalities noted  EKG:  NSR, HR 63, normal axis, nonspecific ST-T wave changes      ASSESSMENT AND PLAN:  1. Hypertension:   Blood pressure remains uncontrolled. We discussed limiting his salt intake. He will continue his current dose of carvedilol.  Increase lisinopril to 20 mg twice a day. Check a basic metabolic panel in one week. 2. Chronic Systolic CHF:   His volume status is stable. He has been tolerating the absence of Lasix well. He can continue taking Lasix as needed. We discussed weighing himself daily. Consider adjusting spironolactone at next visit. If his blood pressure remains difficult to control, consider hydralazine and nitrates. 3. Nonischemic Cardiomyopathy:  As previously noted by Dr. Antoine Poche, he will  require followup echocardiogram in the next several months.  4. Chronic Kidney Disease:  We will keep a close eye on his renal function and potassium with the adjustment in ACE inhibitor. Basic metabolic panel will be planned for next week. 5. Disposition followup with me in one month.  Signed, Tereso Newcomer, PA-C  10:39 AM 07/10/2012

## 2012-07-10 NOTE — Patient Instructions (Addendum)
Your physician recommends that you schedule a follow-up appointment in:  1 month with Tereso Newcomer, PA.  Your physician recommends that you return for lab work in: 1 week--BMP  Your physician has recommended you make the following change in your medication:  Increase Lisinopril to 20 mg by mouth twice daily.  Change Lasix to 20 mg by mouth daily as needed for swelling or weight gain.

## 2012-07-17 ENCOUNTER — Other Ambulatory Visit (INDEPENDENT_AMBULATORY_CARE_PROVIDER_SITE_OTHER): Payer: BC Managed Care – PPO

## 2012-07-17 DIAGNOSIS — I5022 Chronic systolic (congestive) heart failure: Secondary | ICD-10-CM

## 2012-07-17 LAB — BASIC METABOLIC PANEL
BUN: 19 mg/dL (ref 6–23)
CO2: 28 mEq/L (ref 19–32)
Calcium: 9.3 mg/dL (ref 8.4–10.5)
Chloride: 106 mEq/L (ref 96–112)
Creatinine, Ser: 1.4 mg/dL (ref 0.4–1.5)
GFR: 68.17 mL/min (ref >60.00)
Glucose, Bld: 101 mg/dL — ABNORMAL HIGH (ref 70–99)
Potassium: 4.3 mEq/L (ref 3.5–5.1)
Sodium: 140 mEq/L (ref 135–145)

## 2012-08-10 ENCOUNTER — Ambulatory Visit: Payer: BC Managed Care – PPO | Admitting: Physician Assistant

## 2012-08-24 ENCOUNTER — Ambulatory Visit: Payer: BC Managed Care – PPO | Admitting: Physician Assistant

## 2013-01-06 ENCOUNTER — Other Ambulatory Visit: Payer: Self-pay | Admitting: Physician Assistant

## 2013-01-08 ENCOUNTER — Other Ambulatory Visit: Payer: Self-pay | Admitting: Physician Assistant

## 2013-01-15 ENCOUNTER — Other Ambulatory Visit: Payer: Self-pay | Admitting: *Deleted

## 2013-01-15 DIAGNOSIS — I5022 Chronic systolic (congestive) heart failure: Secondary | ICD-10-CM

## 2013-01-15 DIAGNOSIS — I10 Essential (primary) hypertension: Secondary | ICD-10-CM

## 2013-01-15 MED ORDER — LISINOPRIL 20 MG PO TABS: 20.0000 mg | ORAL_TABLET | Freq: Two times a day (BID) | ORAL | Status: DC

## 2013-01-15 MED ORDER — SPIRONOLACTONE 25 MG PO TABS: 12.5000 mg | ORAL_TABLET | Freq: Every day | ORAL | Status: DC

## 2013-02-10 ENCOUNTER — Encounter: Payer: Self-pay | Admitting: Physician Assistant

## 2013-02-10 ENCOUNTER — Telehealth: Payer: Self-pay | Admitting: *Deleted

## 2013-02-10 ENCOUNTER — Ambulatory Visit (INDEPENDENT_AMBULATORY_CARE_PROVIDER_SITE_OTHER): Payer: BC Managed Care – PPO | Admitting: Physician Assistant

## 2013-02-10 VITALS — BP 130/96 | HR 62 | Ht 70.0 in | Wt 276.0 lb

## 2013-02-10 DIAGNOSIS — I10 Essential (primary) hypertension: Secondary | ICD-10-CM

## 2013-02-10 DIAGNOSIS — I5022 Chronic systolic (congestive) heart failure: Secondary | ICD-10-CM

## 2013-02-10 DIAGNOSIS — I428 Other cardiomyopathies: Secondary | ICD-10-CM

## 2013-02-10 DIAGNOSIS — I129 Hypertensive chronic kidney disease with stage 1 through stage 4 chronic kidney disease, or unspecified chronic kidney disease: Secondary | ICD-10-CM

## 2013-02-10 LAB — BASIC METABOLIC PANEL
CO2: 24 mEq/L (ref 19–32)
Chloride: 104 mEq/L (ref 96–112)
Creatinine, Ser: 1.6 mg/dL — ABNORMAL HIGH (ref 0.4–1.5)
Potassium: 5.5 mEq/L — ABNORMAL HIGH (ref 3.5–5.1)
Sodium: 137 mEq/L (ref 135–145)

## 2013-02-10 MED ORDER — SPIRONOLACTONE 25 MG PO TABS: 25.0000 mg | ORAL_TABLET | Freq: Every day | ORAL | Status: DC

## 2013-02-10 MED ORDER — CARVEDILOL 25 MG PO TABS: 25.0000 mg | ORAL_TABLET | Freq: Two times a day (BID) | ORAL | Status: DC

## 2013-02-10 MED ORDER — FUROSEMIDE 20 MG PO TABS: 20.0000 mg | ORAL_TABLET | Freq: Every day | ORAL | Status: DC

## 2013-02-10 MED ORDER — LISINOPRIL 20 MG PO TABS: 20.0000 mg | ORAL_TABLET | Freq: Two times a day (BID) | ORAL | Status: DC

## 2013-02-10 NOTE — Progress Notes (Signed)
[address], [address] [place], [place]  [address] Phone: [phone], Fax:  [phone]  Date:  02/10/2013   ID:  Ian Bentley [birth year], MRN [id number]  PCP:  Ian Barre, MD  Cardiologist:  Dr. Rollene Bentley     History of Present Illness: Ian Bentley is a 47 y.o. male who returns for follow up.  He has a hx of HTN, CKD and chronic systolic CHF secondary to non-ischemic cardiomyopathy. EF is 15-20% by echocardiogram. LHC has demonstrated normal coronary arteries.   Last seen by me 06/2012.  He has been unable to schedule follow up since.  The patient denies chest pain, shortness of breath, syncope, orthopnea, PND or significant pedal edema.  He notes NYHA Class II symptoms.    Labs (9/13):    K 4, creatinine 1.4, LDL 76, Hgb 14.3, TSH 1.824  Labs (10/13):  K 4.2=>3.8, creatinine 1.5=>1.5 Labs (11/13):  K 4.1, creatinine 1.4 Labs (1/14):    K 4.3, Cr 1.4  Wt Readings from Last 3 Encounters:  02/10/13 276 lb (125.193 kg)  07/10/12 271 lb 12.8 oz (123.288 kg)  06/12/12 268 lb 12.8 oz (121.927 kg)     Past Medical History  Diagnosis Date  . Hypertension 2007  . High cholesterol 02/25/2012  . Depression     Previously treated with Citalopram 20mg   . NICM (nonischemic cardiomyopathy)     a. Echo 9/13: severe LV dilation, normal wall thickness, EF 15-20%, mild MR, mild LAE, mild RVE, mild reduced RV fxn, mild RAE;  b.  LHC 10/13:  normal coronary arteries; EF 25%  . Chronic systolic heart failure     a. admx 9/13 (new dx)  . CKD (chronic kidney disease) stage 3, GFR 30-59 ml/min     Creatinine (9/13) 1.6 =>1.5  . NSVT (nonsustained ventricular tachycardia)     Current Outpatient Prescriptions  Medication Sig Dispense Refill  . aspirin 81 MG tablet Take 81 mg by mouth daily.      . carvedilol (COREG) 12.5 MG tablet TAKE ONE TABLET BY MOUTH TWICE DAILY  180 tablet  0  . carvedilol (COREG) 6.25 MG tablet Take 1 tablet (6.25 mg total) by mouth 2  (two) times daily.  180 tablet  3  . furosemide (LASIX) 20 MG tablet Take 1 tablet (20 mg total) by mouth daily. As needed for increased swelling or weight gain of 3 lbs or more in 1 day.  30 tablet  5  . lisinopril (PRINIVIL,ZESTRIL) 20 MG tablet Take 1 tablet (20 mg total) by mouth 2 (two) times daily.  60 tablet  0  . spironolactone (ALDACTONE) 25 MG tablet Take 0.5 tablets (12.5 mg total) by mouth daily.  30 tablet  0   No current facility-administered medications for this visit.    Allergies:   No Known Allergies  Social History:  The patient  reports that he has never smoked. He has never used smokeless tobacco. He reports that he does not drink alcohol or use illicit drugs.   ROS:  Please see the history of present illness.     All other systems reviewed and negative.   PHYSICAL EXAM: VS:  BP 130/96  Pulse 62  Ht 5\' 10"  (1.778 m)  Wt 276 lb (125.193 kg)  BMI 39.6 kg/m2 Well nourished, well developed, in no acute distress HEENT: normal Neck: no JVD Cardiac:  normal S1, S2; RRR; no murmur Lungs:  clear to auscultation bilaterally, no wheezing, rhonchi or rales  Abd: soft, nontender, no hepatomegaly Ext: no edema Skin: warm and dry Neuro:  CNs 2-12 intact, no focal abnormalities noted  EKG:  NSR, HR 62, normal axis, inferolateral T wave inversions  ASSESSMENT AND PLAN:  1. Hypertension:   Remains uncontrolled. I will adjust his carvedilol to 25 mg twice a day. I will also adjust the spironolactone to 25 mg daily. Check a basic metabolic panel today and repeat in one week. 2. Chronic Systolic CHF:   Volume remained stable. 3. Nonischemic Cardiomyopathy:  Adjust medications as noted above for blood pressure.  Once he is on maximal medical therapy with controlled blood pressure, arrange follow up echocardiogram. 4. Chronic Kidney Disease:  Monitor renal function closely with adjustments in medications as noted. 5. Disposition:  Followup with me in one month.  Ian Glasgow, PA-C  10:32 AM 02/10/2013

## 2013-02-10 NOTE — Patient Instructions (Addendum)
INCREASE COREG TO 25 MG TWICE DAILY  INCREASE SPIRONOLACTONE 25 MG DAILY  LAB TODAY; BMET  REPEAT BMET 1 WEEK  PLEASE FOLLOW UP WITH SCOTT WEAVER, PAC IN 1 MONTH

## 2013-02-10 NOTE — Telephone Encounter (Signed)
pt notified about lab results today. Explianed to pt blood was Hemolyzed and explained we need to repeat 02/11/13, pt said ok, I also stated to pt that we will not charge for repeat bmet, pt said thank you

## 2013-02-10 NOTE — Telephone Encounter (Signed)
Message copied by Tarri Fuller on Wed Feb 10, 2013  5:44 PM ------      Message from: [place], [place] T      Created: Wed Feb 10, 2013  4:11 PM       K+ high but sample hemolyzed      Repeat BMET tomorrow 8/21      Creatinine stable      Tereso Newcomer, [place]        02/10/2013 4:11 PM ------

## 2013-02-11 ENCOUNTER — Other Ambulatory Visit (INDEPENDENT_AMBULATORY_CARE_PROVIDER_SITE_OTHER): Payer: BC Managed Care – PPO

## 2013-02-11 DIAGNOSIS — I129 Hypertensive chronic kidney disease with stage 1 through stage 4 chronic kidney disease, or unspecified chronic kidney disease: Secondary | ICD-10-CM

## 2013-02-11 DIAGNOSIS — I5022 Chronic systolic (congestive) heart failure: Secondary | ICD-10-CM

## 2013-02-11 DIAGNOSIS — I10 Essential (primary) hypertension: Secondary | ICD-10-CM

## 2013-02-11 LAB — BASIC METABOLIC PANEL
BUN: 20 mg/dL (ref 6–23)
CO2: 27 mEq/L (ref 19–32)
Chloride: 107 mEq/L (ref 96–112)
GFR: 61.93 mL/min (ref >60.00)
Glucose, Bld: 117 mg/dL — ABNORMAL HIGH (ref 70–99)
Potassium: 4.1 mEq/L (ref 3.5–5.1)
Sodium: 137 mEq/L (ref 135–145)

## 2013-02-12 ENCOUNTER — Telehealth: Payer: Self-pay | Admitting: *Deleted

## 2013-02-12 NOTE — Telephone Encounter (Signed)
Message copied by Tarri Fuller on Fri Feb 12, 2013  8:51 AM ------      Message from: [place], [place] T      Created: Thu Feb 11, 2013  5:30 PM       Potassium and kidney function ok      Continue with current treatment plan.      Tereso Newcomer, PA-C        02/11/2013 5:30 PM ------

## 2013-02-12 NOTE — Telephone Encounter (Signed)
pt notified about repeat bmet results, K+ 4.1 looks good. pt verbalized understanding

## 2013-02-17 ENCOUNTER — Telehealth: Payer: Self-pay | Admitting: *Deleted

## 2013-02-17 ENCOUNTER — Other Ambulatory Visit (INDEPENDENT_AMBULATORY_CARE_PROVIDER_SITE_OTHER): Payer: BC Managed Care – PPO

## 2013-02-17 DIAGNOSIS — I5022 Chronic systolic (congestive) heart failure: Secondary | ICD-10-CM

## 2013-02-17 LAB — BASIC METABOLIC PANEL
CO2: 27 mEq/L (ref 19–32)
Calcium: 9.3 mg/dL (ref 8.4–10.5)
Chloride: 110 mEq/L (ref 96–112)
Creatinine, Ser: 1.5 mg/dL (ref 0.4–1.5)
Glucose, Bld: 123 mg/dL — ABNORMAL HIGH (ref 70–99)

## 2013-02-17 NOTE — Telephone Encounter (Signed)
lmom labs ok, no changes to be made 

## 2013-02-17 NOTE — Telephone Encounter (Signed)
Message copied by Tarri Fuller on Wed Feb 17, 2013  5:25 PM ------      Message from: [place], [place] T      Created: Wed Feb 17, 2013  4:50 PM       Potassium and kidney function ok      Continue with current treatment plan.      Tereso Newcomer, PA-C        02/17/2013 4:50 PM ------

## 2013-03-23 ENCOUNTER — Ambulatory Visit (INDEPENDENT_AMBULATORY_CARE_PROVIDER_SITE_OTHER): Payer: BC Managed Care – PPO | Admitting: Physician Assistant

## 2013-03-23 ENCOUNTER — Encounter: Payer: Self-pay | Admitting: Physician Assistant

## 2013-03-23 VITALS — BP 112/74 | HR 65 | Ht 70.0 in | Wt 270.8 lb

## 2013-03-23 DIAGNOSIS — I5022 Chronic systolic (congestive) heart failure: Secondary | ICD-10-CM

## 2013-03-23 DIAGNOSIS — N183 Chronic kidney disease, stage 3 unspecified: Secondary | ICD-10-CM

## 2013-03-23 DIAGNOSIS — I10 Essential (primary) hypertension: Secondary | ICD-10-CM

## 2013-03-23 DIAGNOSIS — I428 Other cardiomyopathies: Secondary | ICD-10-CM

## 2013-03-23 LAB — BASIC METABOLIC PANEL
BUN: 16 mg/dL (ref 6–23)
Creatinine, Ser: 1.5 mg/dL (ref 0.4–1.5)
GFR: 65.32 mL/min (ref >60.00)
Glucose, Bld: 96 mg/dL (ref 70–99)
Potassium: 3.9 mEq/L (ref 3.5–5.1)

## 2013-03-23 NOTE — Patient Instructions (Addendum)
Your physician recommends that you continue on your current medications as directed. Please refer to the Current Medication list given to you today.  LAB TODAY: BMET  Your physician has requested that you have an echocardiogram. Echocardiography is a painless test that uses sound waves to create images of your heart. It provides your doctor with information about the size and shape of your heart and how well your heart's chambers and valves are working. This procedure takes approximately one hour. There are no restrictions for this procedure.  Your physician recommends that you schedule a follow-up appointment in: 3 MONTHS WITH [place], [place]. A DAY THAT DR.HOCHREIN IS IN THE OFFICE

## 2013-03-23 NOTE — Progress Notes (Signed)
[address] [place], [place]  [address] Phone: [phone] Fax:  [phone]  Date:  03/23/2013   ID:  Ian Bentley, DOB [birth year], MRN [id number]  PCP:  Oliver Barre, MD  Cardiologist:  Dr. Rollene Rotunda     History of Present Illness: Ian Bentley is a 47 y.o. male who returns for follow up.  He has a hx of HTN, CKD and chronic systolic CHF secondary to non-ischemic cardiomyopathy. EF is 15-20% by echocardiogram. LHC has demonstrated normal coronary arteries.   I saw him in follow up 02/10/13. I adjusted his medications.  He returns for further medication titration.  The patient denies chest pain, shortness of breath, syncope, orthopnea, PND or significant pedal edema.   Labs (9/13):    K 4, creatinine 1.4, LDL 76, Hgb 14.3, TSH 1.824  Labs (10/13):  K 4.2=>3.8, creatinine 1.5=>1.5 Labs (11/13):  K 4.1, creatinine 1.4 Labs (1/14):    K 4.3, Cr 1.4 Labs (8/14):    5.5=>4.1=>4.1, Creatinine 1.6=>1.5  Wt Readings from Last 3 Encounters:  03/23/13 270 lb 12.8 oz (122.834 kg)  02/10/13 276 lb (125.193 kg)  07/10/12 271 lb 12.8 oz (123.288 kg)     Past Medical History  Diagnosis Date  . Hypertension 2007  . High cholesterol 02/25/2012  . Depression     Previously treated with Citalopram 20mg   . NICM (nonischemic cardiomyopathy)     a. Echo 9/13: severe LV dilation, normal wall thickness, EF 15-20%, mild MR, mild LAE, mild RVE, mild reduced RV fxn, mild RAE;  b.  LHC 10/13:  normal coronary arteries; EF 25%  . Chronic systolic heart failure     a. admx 9/13 (new dx)  . CKD (chronic kidney disease) stage 3, GFR 30-59 ml/min     Creatinine (9/13) 1.6 =>1.5  . NSVT (nonsustained ventricular tachycardia)     Current Outpatient Prescriptions  Medication Sig Dispense Refill  . aspirin 81 MG tablet Take 81 mg by mouth daily.      . carvedilol (COREG) 25 MG tablet Take 1 tablet (25 mg total) by mouth 2 (two) times daily with a meal.  60 tablet  6  .  furosemide (LASIX) 20 MG tablet Take 1 tablet (20 mg total) by mouth daily. As needed for increased swelling or weight gain of 3 lbs or more in 1 day.  30 tablet  6  . lisinopril (PRINIVIL,ZESTRIL) 20 MG tablet Take 1 tablet (20 mg total) by mouth 2 (two) times daily.  60 tablet  6  . spironolactone (ALDACTONE) 25 MG tablet Take 1 tablet (25 mg total) by mouth daily.  30 tablet  6   No current facility-administered medications for this visit.    Allergies:   No Known Allergies  Social History:  The patient  reports that he has never smoked. He has never used smokeless tobacco. He reports that he does not drink alcohol or use illicit drugs.   ROS:  Please see the history of present illness.     All other systems reviewed and negative.   PHYSICAL EXAM: VS:  BP 112/74  Pulse 65  Ht 5\' 10"  (1.778 m)  Wt 270 lb 12.8 oz (122.834 kg)  BMI 38.86 kg/m2 Well nourished, well developed, in no acute distress HEENT: normal Neck: no JVD Cardiac:  normal S1, S2; RRR; no murmur Lungs:  clear to auscultation bilaterally, no wheezing, rhonchi or rales Abd: soft, nontender, no hepatomegaly Ext: no edema Skin: warm and  dry Neuro:  CNs 2-12 intact, no focal abnormalities noted  EKG:   NSR, HR 63, normal axis, inferolateral T-wave inversions  ASSESSMENT AND PLAN:  1. Hypertension:   Controlled.  Continue current therapy.  2. Chronic Systolic CHF:  Volume remains stable. Continue current therapy. 3. Nonischemic Cardiomyopathy:  Blood pressure is well controlled. He is on an excellent medical regimen for his congestive heart failure. I will arrange follow up echocardiogram to reassess his LV function.  If his ejection fraction remains less than 35%, consider referral to EP for possible ICD implantation. 4. Chronic Kidney Disease:  Arrange  Follow up basic metabolic panel today. 5. Disposition:  Follow up with me were Dr. Antoine Poche in 3 months.  Ian Glasgow, PA-C  10:53 AM 03/23/2013

## 2013-04-19 ENCOUNTER — Ambulatory Visit (HOSPITAL_COMMUNITY): Payer: BC Managed Care – PPO | Attending: Cardiovascular Disease | Admitting: Radiology

## 2013-04-19 ENCOUNTER — Other Ambulatory Visit (HOSPITAL_COMMUNITY): Payer: Self-pay | Admitting: Cardiology

## 2013-04-19 DIAGNOSIS — I509 Heart failure, unspecified: Secondary | ICD-10-CM | POA: Insufficient documentation

## 2013-04-19 DIAGNOSIS — I5022 Chronic systolic (congestive) heart failure: Secondary | ICD-10-CM

## 2013-04-19 DIAGNOSIS — I428 Other cardiomyopathies: Secondary | ICD-10-CM | POA: Insufficient documentation

## 2013-04-19 DIAGNOSIS — I10 Essential (primary) hypertension: Secondary | ICD-10-CM | POA: Insufficient documentation

## 2013-04-19 NOTE — Progress Notes (Signed)
Echocardiogram performed.  

## 2013-05-14 ENCOUNTER — Encounter: Payer: Self-pay | Admitting: *Deleted

## 2013-06-22 ENCOUNTER — Ambulatory Visit (INDEPENDENT_AMBULATORY_CARE_PROVIDER_SITE_OTHER): Payer: BC Managed Care – PPO | Admitting: Cardiology

## 2013-06-22 ENCOUNTER — Encounter: Payer: Self-pay | Admitting: Cardiology

## 2013-06-22 VITALS — BP 118/77 | HR 60 | Ht 70.0 in | Wt 271.1 lb

## 2013-06-22 DIAGNOSIS — I10 Essential (primary) hypertension: Secondary | ICD-10-CM

## 2013-06-22 DIAGNOSIS — I428 Other cardiomyopathies: Secondary | ICD-10-CM

## 2013-06-22 NOTE — Patient Instructions (Signed)
The current medical regimen is effective;  continue present plan and medications.  Follow up in 6 months with Dr Hochrein.  You will receive a letter in the mail 2 months before you are due.  Please call us when you receive this letter to schedule your follow up appointment.  

## 2013-06-22 NOTE — Progress Notes (Signed)
   HPI The patient presents for followup of his nonischemic cardiomyopathy. He says he feels quite well. He does not exercise.  He has tolerated med titration.  The patient denies any new symptoms such as chest discomfort, neck or arm discomfort. There has been no new shortness of breath, PND or orthopnea. There have been no reported palpitations, presyncope or syncope.    No Known Allergies  Current Outpatient Prescriptions  Medication Sig Dispense Refill  . aspirin 81 MG tablet Take 81 mg by mouth daily.      . carvedilol (COREG) 25 MG tablet Take 1 tablet (25 mg total) by mouth 2 (two) times daily with a meal.  60 tablet  6  . furosemide (LASIX) 20 MG tablet Take 1 tablet (20 mg total) by mouth daily. As needed for increased swelling or weight gain of 3 lbs or more in 1 day.  30 tablet  6  . lisinopril (PRINIVIL,ZESTRIL) 20 MG tablet Take 1 tablet (20 mg total) by mouth 2 (two) times daily.  60 tablet  6  . spironolactone (ALDACTONE) 25 MG tablet Take 1 tablet (25 mg total) by mouth daily.  30 tablet  6   No current facility-administered medications for this visit.    Past Medical History  Diagnosis Date  . Hypertension 2007  . High cholesterol 02/25/2012  . Depression     Previously treated with Citalopram 20mg   . NICM (nonischemic cardiomyopathy)     a. Echo 9/13: severe LV dilation, normal wall thickness, EF 15-20%, mild MR, mild LAE, mild RVE, mild reduced RV fxn, mild RAE;  b.  LHC 10/13:  normal coronary arteries; EF 25%  . Chronic systolic heart failure     a. admx 9/13 (new dx)  . CKD (chronic kidney disease) stage 3, GFR 30-59 ml/min     Creatinine (9/13) 1.6 =>1.5  . NSVT (nonsustained ventricular tachycardia)     Past Surgical History  Procedure Laterality Date  . No past surgeries      ROS:  As stated in the HPI and negative for all other systems.  PHYSICAL EXAM BP 118/77  Pulse 60  Ht 5\' 10"  (1.778 m)  Wt 271 lb 1.9 oz (122.979 kg)  BMI 38.90 kg/m2 GENERAL:   Well appearing NECK:  No jugular venous distention, waveform within normal limits, carotid upstroke brisk and symmetric, no bruits, no thyromegaly LUNGS:  Clear to auscultation bilaterally HEART:  PMI not displaced or sustained,S1 and S2 within normal limits, no S3, no S4, no clicks, no rubs, no murmurs ABD:  Flat, positive bowel sounds normal in frequency in pitch, no bruits, no rebound, no guarding, no midline pulsatile mass, no hepatomegaly, no splenomegaly EXT:  2 plus pulses throughout, no edema, no cyanosis no clubbing   ASSESSMENT AND PLAN  Chronic Systolic CHF:  He seems to be euvolemic.  At this point, no change in therapy is indicated.  We have reviewed salt and fluid restrictions.  No further cardiovascular testing is indicated.  Non-Ischemic Cardiomyopathy: As above.   Hypertension:  This is being managed in the context of treating his CHF  Obesity: We discussed the ned for diet and exercise.

## 2013-09-14 ENCOUNTER — Other Ambulatory Visit: Payer: Self-pay | Admitting: Physician Assistant

## 2013-11-05 IMAGING — CR DG CHEST 2V
2 series · 2 of 2 positions shown · non-contrast
Comparison: None.

CLINICAL DATA: Chest pain.  Shortness of breath.

CHEST - 2 VIEW

[w chest pa]
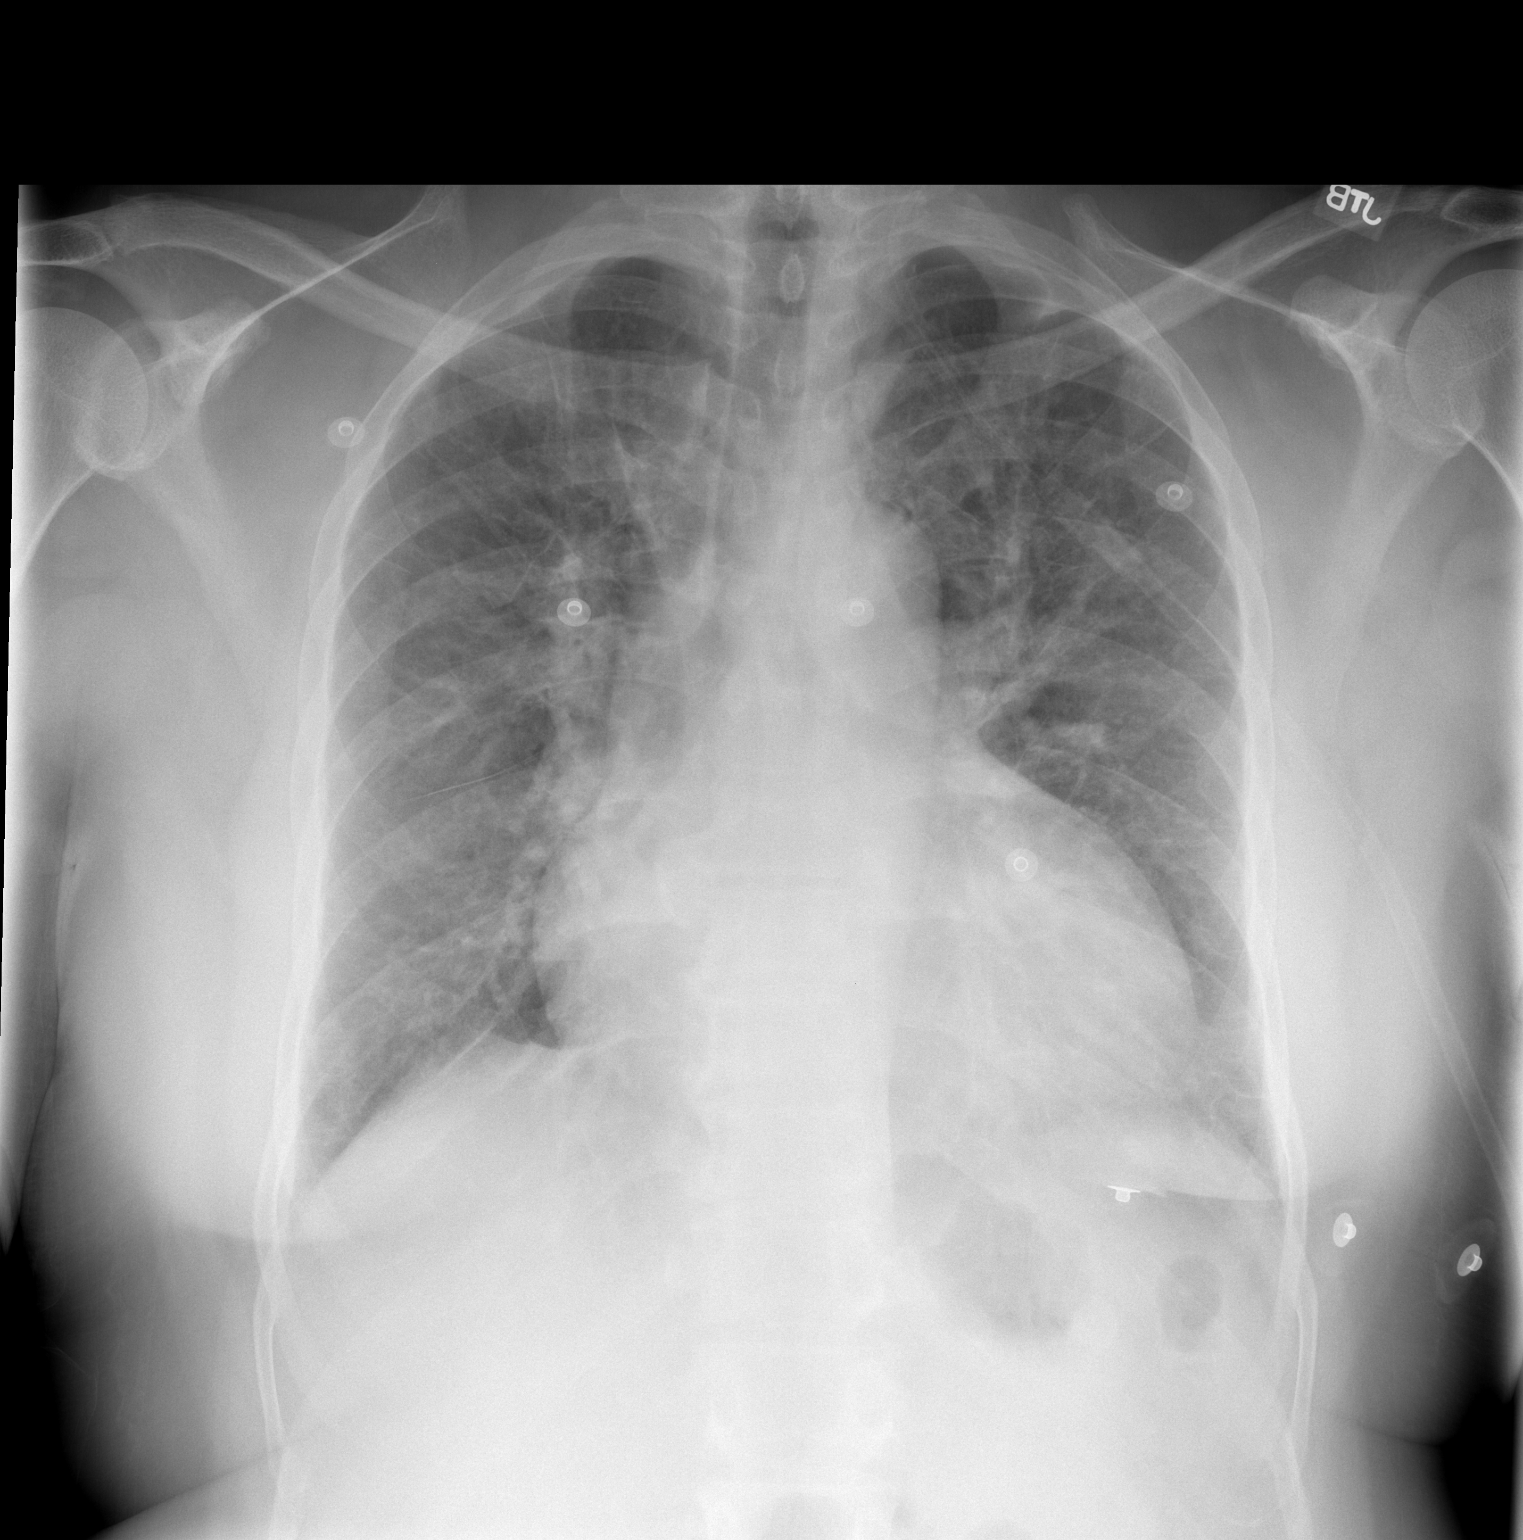

[w chest lat]
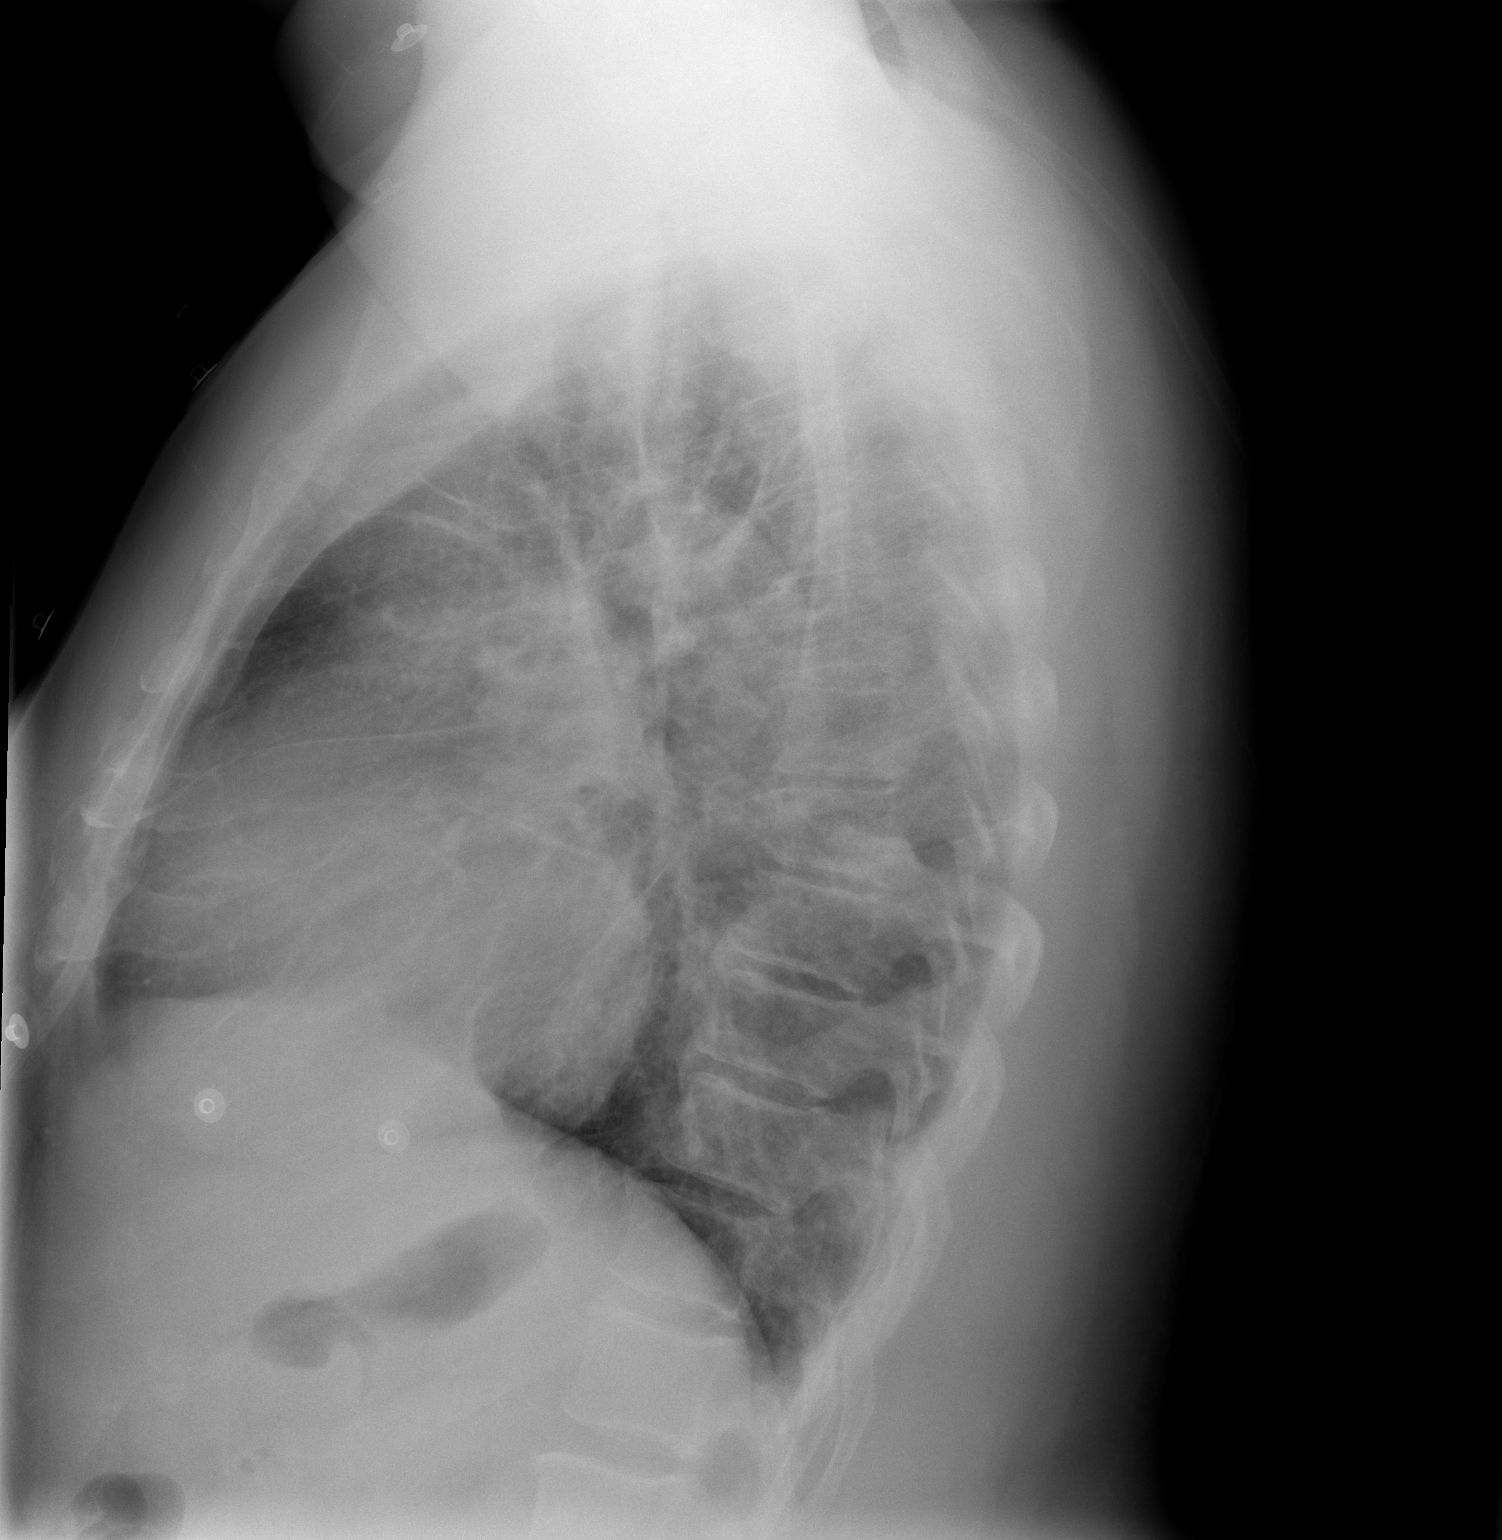

[2 of 2 positions shown; findings below may reference images not displayed]

FINDINGS: Cardiac silhouette mildly to moderately enlarged.
Pulmonary venous hypertension and mild interstitial pulmonary edema
as evidenced by Kerley B lines.  No confluent airspace
consolidation.  No pleural effusions.  Degenerative changes
involving the thoracic spine.
IMPRESSION: Mild CHF, with mild to moderate cardiomegaly and mild interstitial
pulmonary edema.

## 2013-12-27 ENCOUNTER — Other Ambulatory Visit: Payer: Self-pay | Admitting: *Deleted

## 2013-12-27 DIAGNOSIS — I5022 Chronic systolic (congestive) heart failure: Secondary | ICD-10-CM

## 2013-12-27 DIAGNOSIS — I10 Essential (primary) hypertension: Secondary | ICD-10-CM

## 2013-12-27 MED ORDER — CARVEDILOL 25 MG PO TABS: ORAL_TABLET | ORAL | Status: DC

## 2013-12-27 MED ORDER — LISINOPRIL 20 MG PO TABS: ORAL_TABLET | ORAL | Status: DC

## 2013-12-27 MED ORDER — FUROSEMIDE 20 MG PO TABS: 20.0000 mg | ORAL_TABLET | Freq: Every day | ORAL | Status: DC

## 2013-12-27 MED ORDER — SPIRONOLACTONE 25 MG PO TABS: ORAL_TABLET | ORAL | Status: DC

## 2014-01-28 ENCOUNTER — Ambulatory Visit: Payer: BC Managed Care – PPO | Admitting: Cardiology

## 2014-02-17 ENCOUNTER — Other Ambulatory Visit: Payer: Self-pay | Admitting: Cardiology

## 2014-03-14 ENCOUNTER — Encounter: Payer: Self-pay | Admitting: Cardiology

## 2014-03-14 ENCOUNTER — Ambulatory Visit (INDEPENDENT_AMBULATORY_CARE_PROVIDER_SITE_OTHER): Payer: BC Managed Care – PPO | Admitting: Cardiology

## 2014-03-14 VITALS — BP 122/62 | HR 63 | Ht 70.0 in | Wt 259.0 lb

## 2014-03-14 DIAGNOSIS — I10 Essential (primary) hypertension: Secondary | ICD-10-CM

## 2014-03-14 DIAGNOSIS — I428 Other cardiomyopathies: Secondary | ICD-10-CM

## 2014-03-14 DIAGNOSIS — I5022 Chronic systolic (congestive) heart failure: Secondary | ICD-10-CM

## 2014-03-14 NOTE — Progress Notes (Signed)
   HPI The patient presents for followup of his nonischemic cardiomyopathy. He says he feels quite well. He does exercise a few days per week. 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 There has been no new shortness of breath, PND or orthopnea. There have been no reported palpitations, presyncope or syncope.    No Known Allergies  Current Outpatient Prescriptions  Medication Sig Dispense Refill  . aspirin 81 MG tablet Take 81 mg by mouth daily.      . carvedilol (COREG) 25 MG tablet TAKE ONE TABLET BY MOUTH TWICE DAILY WITH  A  MEAL  60 tablet  6  . furosemide (LASIX) 20 MG tablet Take 1 tablet (20 mg total) by mouth daily.  30 tablet  0  . lisinopril (PRINIVIL,ZESTRIL) 20 MG tablet TAKE ONE TABLET BY MOUTH TWICE DAILY  60 tablet  6  . spironolactone (ALDACTONE) 25 MG tablet TAKE ONE TABLET BY MOUTH ONCE DAILY  30 tablet  6   No current facility-administered medications for this visit.    Past Medical History  Diagnosis Date  . Hypertension 2007  . High cholesterol 02/25/2012  . Depression     Previously treated with Citalopram 20mg   . NICM (nonischemic cardiomyopathy)     a. Echo 9/13: severe LV dilation, normal wall thickness, EF 15-20%, mild MR, mild LAE, mild RVE, mild reduced RV fxn, mild RAE;  b.  LHC 10/13:  normal coronary arteries; EF 25%,  EF 45% by echo 10/14  . Chronic systolic heart failure     a. admx 9/13 (new dx)  . CKD (chronic kidney disease) stage 3, GFR 30-59 ml/min     Creatinine (9/13) 1.6 =>1.5  . NSVT (nonsustained ventricular tachycardia)     Past Surgical History  Procedure Laterality Date  . No past surgeries      ROS:  As stated in the HPI and negative for all other systems.  PHYSICAL EXAM BP 122/62  Pulse 63  Ht 5\' 10"  (1.778 m)  Wt 259 lb (117.482 kg)  BMI 37.16 kg/m2 GENERAL:  Well appearing NECK:  No jugular venous distention, waveform within normal limits,  carotid upstroke brisk and symmetric, no bruits, no thyromegaly LUNGS:  Clear to auscultation bilaterally HEART:  PMI not displaced or sustained,S1 and S2 within normal limits, no S3, no S4, no clicks, no rubs, no murmurs ABD:  Flat, positive bowel sounds normal in frequency in pitch, no bruits, no rebound, no guarding, no midline pulsatile mass, no hepatomegaly, no splenomegaly EXT:  2 plus pulses throughout, no edema, no cyanosis no clubbing  EKG:  Sinus rhythm, rate 63, axis within normal limits, intervals within normal limits, poor anterior R wave progression, nonspecific inferior T-wave changes unchanged from previous. 03/14/2014  ASSESSMENT AND PLAN  Chronic Systolic CHF:  He seems to be euvolemic.  At this point, no change in therapy is indicated.  We have reviewed salt and fluid restrictions.  No further cardiovascular testing is indicated this year.  However, I will order an echo for next year and I will see him in follow up.   He will continue meds as listed.  I will check a BMET.   Non-Ischemic Cardiomyopathy: As above.   Hypertension:  This is being managed in the context of treating his CHF  Obesity: We discussed the continued need for diet and exercise.

## 2014-03-14 NOTE — Patient Instructions (Signed)
Your physician recommends that you schedule a follow-up appointment in: one year with Dr. Antoine Poche  We will do an Echo in one year

## 2014-03-15 LAB — BASIC METABOLIC PANEL
BUN: 19 mg/dL (ref 6–23)
CALCIUM: 9.5 mg/dL (ref 8.4–10.5)
CO2: 25 mEq/L (ref 19–32)
Chloride: 108 mEq/L (ref 96–112)
Creat: 2.06 mg/dL — ABNORMAL HIGH (ref 0.50–1.35)
GLUCOSE: 103 mg/dL — AB (ref 70–99)
Potassium: 6 mEq/L — ABNORMAL HIGH (ref 3.5–5.3)
SODIUM: 137 meq/L (ref 135–145)

## 2014-03-16 ENCOUNTER — Other Ambulatory Visit: Payer: Self-pay | Admitting: *Deleted

## 2014-03-16 DIAGNOSIS — Z79899 Other long term (current) drug therapy: Secondary | ICD-10-CM

## 2014-03-16 DIAGNOSIS — E875 Hyperkalemia: Secondary | ICD-10-CM

## 2014-03-17 ENCOUNTER — Other Ambulatory Visit: Payer: Self-pay | Admitting: *Deleted

## 2014-03-17 ENCOUNTER — Other Ambulatory Visit: Payer: Self-pay | Admitting: Physician Assistant

## 2014-03-17 ENCOUNTER — Telehealth: Payer: Self-pay | Admitting: *Deleted

## 2014-03-17 DIAGNOSIS — E878 Other disorders of electrolyte and fluid balance, not elsewhere classified: Secondary | ICD-10-CM

## 2014-03-17 LAB — BASIC METABOLIC PANEL
BUN: 25 mg/dL — ABNORMAL HIGH (ref 6–23)
CHLORIDE: 105 meq/L (ref 96–112)
CO2: 22 meq/L (ref 19–32)
Calcium: 9.2 mg/dL (ref 8.4–10.5)
Creat: 2.23 mg/dL — ABNORMAL HIGH (ref 0.50–1.35)
Glucose, Bld: 95 mg/dL (ref 70–99)
POTASSIUM: 6.5 meq/L — AB (ref 3.5–5.3)
SODIUM: 135 meq/L (ref 135–145)

## 2014-03-17 MED ORDER — SODIUM POLYSTYRENE SULFONATE PO POWD: Freq: Once | ORAL | Status: DC

## 2014-03-17 NOTE — Progress Notes (Signed)
Bmp in for Monday, script sent to pharmacy, pt. Called and message left with instructions on what needed to be done

## 2014-03-17 NOTE — Telephone Encounter (Signed)
Pt. Called no answer, left message to pick up Kayexalate  And take 15gms today . This is a one time dose and we will repeat a bmp on Monday

## 2014-03-22 ENCOUNTER — Other Ambulatory Visit: Payer: Self-pay | Admitting: *Deleted

## 2014-03-22 DIAGNOSIS — E878 Other disorders of electrolyte and fluid balance, not elsewhere classified: Secondary | ICD-10-CM

## 2014-03-22 LAB — BASIC METABOLIC PANEL
BUN: 27 mg/dL — AB (ref 6–23)
CHLORIDE: 107 meq/L (ref 96–112)
CO2: 20 meq/L (ref 19–32)
CREATININE: 2.36 mg/dL — AB (ref 0.50–1.35)
Calcium: 9.1 mg/dL (ref 8.4–10.5)
Glucose, Bld: 111 mg/dL — ABNORMAL HIGH (ref 70–99)
Potassium: 5.4 mEq/L — ABNORMAL HIGH (ref 3.5–5.3)
Sodium: 137 mEq/L (ref 135–145)

## 2014-03-22 NOTE — Telephone Encounter (Signed)
Pt. Called and reminded to have his labs drawn today

## 2014-03-23 ENCOUNTER — Other Ambulatory Visit: Payer: Self-pay | Admitting: *Deleted

## 2014-03-23 DIAGNOSIS — Z79899 Other long term (current) drug therapy: Secondary | ICD-10-CM

## 2014-03-23 DIAGNOSIS — E875 Hyperkalemia: Secondary | ICD-10-CM

## 2014-03-25 LAB — BASIC METABOLIC PANEL
BUN: 21 mg/dL (ref 6–23)
CHLORIDE: 107 meq/L (ref 96–112)
CO2: 23 meq/L (ref 19–32)
CREATININE: 1.92 mg/dL — AB (ref 0.50–1.35)
Calcium: 9.2 mg/dL (ref 8.4–10.5)
GLUCOSE: 94 mg/dL (ref 70–99)
Potassium: 6.2 mEq/L — ABNORMAL HIGH (ref 3.5–5.3)
Sodium: 137 mEq/L (ref 135–145)

## 2014-03-28 ENCOUNTER — Other Ambulatory Visit: Payer: Self-pay | Admitting: *Deleted

## 2014-03-28 DIAGNOSIS — E878 Other disorders of electrolyte and fluid balance, not elsewhere classified: Secondary | ICD-10-CM

## 2014-03-28 MED ORDER — SODIUM POLYSTYRENE SULFONATE PO POWD: Freq: Once | ORAL | Status: DC

## 2014-03-29 ENCOUNTER — Encounter: Payer: Self-pay | Admitting: Cardiology

## 2014-03-31 ENCOUNTER — Telehealth: Payer: Self-pay | Admitting: Cardiology

## 2014-03-31 DIAGNOSIS — Z79899 Other long term (current) drug therapy: Secondary | ICD-10-CM

## 2014-03-31 NOTE — Telephone Encounter (Signed)
Message forwarded to Dr. Antoine Poche and Kem Boroughs

## 2014-03-31 NOTE — Telephone Encounter (Signed)
Pt was returning the nurse's call about the medication Kionex that was prescribed for him to help with his potassium levels. 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 Please call him back in the morning to discuss options  thanks

## 2014-03-31 NOTE — Telephone Encounter (Signed)
He needs to come back in tomorrow for labs.  They need to be run stat and we need to know the results before the office closes.  He needs to get the kayexelate so that if he needs it he has it.  He needs to understand that a high potassium is dangerous.

## 2014-04-01 ENCOUNTER — Other Ambulatory Visit: Payer: Self-pay | Admitting: *Deleted

## 2014-04-01 ENCOUNTER — Telehealth: Payer: Self-pay | Admitting: Cardiology

## 2014-04-01 LAB — BASIC METABOLIC PANEL
BUN: 30 mg/dL — AB (ref 6–23)
CO2: 25 mEq/L (ref 19–32)
CREATININE: 2.42 mg/dL — AB (ref 0.50–1.35)
Calcium: 9.5 mg/dL (ref 8.4–10.5)
Chloride: 108 mEq/L (ref 96–112)
Glucose, Bld: 89 mg/dL (ref 70–99)
Potassium: 5.9 mEq/L — ABNORMAL HIGH (ref 3.5–5.3)
Sodium: 141 mEq/L (ref 135–145)

## 2014-04-01 NOTE — Telephone Encounter (Signed)
Order placed for STAT BMP to be done today.

## 2014-04-01 NOTE — Telephone Encounter (Signed)
Spoke w/patient.  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  Expressed -again - how dangerous it is to have a high potassium level.  Reassure me he will go to the lab now.  BMP ordered STAT.   Call cell [phone] with results.  Info sent to Dr. Antoine Poche.

## 2014-04-01 NOTE — Telephone Encounter (Signed)
Spoke with wife. Stress the importance of taking Kayexelate as directed.  Needs to get stat labs this AM.  States he gets home @ 10am.  She would like me to talk with him about this (he doesn't listen to her).  Asked her to have him call me.  She voiced understanding.

## 2014-04-01 NOTE — Telephone Encounter (Signed)
Close encounter 

## 2014-04-01 NOTE — Telephone Encounter (Signed)
Pt. Called this morning no answer, left message of importance of taking medication for high potassium, and to get labs done today .

## 2014-04-08 ENCOUNTER — Other Ambulatory Visit: Payer: Self-pay | Admitting: *Deleted

## 2014-04-08 DIAGNOSIS — E878 Other disorders of electrolyte and fluid balance, not elsewhere classified: Secondary | ICD-10-CM

## 2014-04-12 LAB — BASIC METABOLIC PANEL
BUN: 15 mg/dL (ref 6–23)
CO2: 29 mEq/L (ref 19–32)
Calcium: 9.3 mg/dL (ref 8.4–10.5)
Chloride: 103 mEq/L (ref 96–112)
Creat: 1.71 mg/dL — ABNORMAL HIGH (ref 0.50–1.35)
Glucose, Bld: 98 mg/dL (ref 70–99)
Potassium: 4.4 mEq/L (ref 3.5–5.3)
SODIUM: 138 meq/L (ref 135–145)

## 2014-09-02 ENCOUNTER — Telehealth: Payer: Self-pay

## 2014-09-05 NOTE — Telephone Encounter (Signed)
LVM for the patient to call the practice to confirm date of flu vaccination or to call the office for an apt to take a flu shot. 

## 2014-09-30 ENCOUNTER — Other Ambulatory Visit: Payer: Self-pay | Admitting: *Deleted

## 2014-09-30 MED ORDER — CARVEDILOL 25 MG PO TABS: ORAL_TABLET | ORAL | Status: DC

## 2014-12-13 ENCOUNTER — Other Ambulatory Visit: Payer: Self-pay | Admitting: Cardiology

## 2014-12-13 MED ORDER — LISINOPRIL 20 MG PO TABS: 20.0000 mg | ORAL_TABLET | Freq: Two times a day (BID) | ORAL | Status: DC

## 2015-01-20 ENCOUNTER — Telehealth: Payer: Self-pay | Admitting: Internal Medicine

## 2015-01-20 ENCOUNTER — Other Ambulatory Visit: Payer: Self-pay | Admitting: Cardiology

## 2015-01-20 NOTE — Telephone Encounter (Signed)
REFILL 

## 2015-01-20 NOTE — Telephone Encounter (Signed)
Pt called in , needs refill on his   carvedilol (COREG) 25 MG tablet [[id number]]  Lisinopril    Pharmacy - [facility] that is on file

## 2015-01-20 NOTE — Telephone Encounter (Signed)
Not a pt of Dr Jonny Ruiz. Pt's medication is managed by Dr Antoine Poche

## 2015-03-08 ENCOUNTER — Other Ambulatory Visit: Payer: Self-pay | Admitting: Cardiology

## 2015-03-09 ENCOUNTER — Encounter: Payer: Self-pay | Admitting: Cardiology

## 2015-03-10 ENCOUNTER — Telehealth: Payer: Self-pay | Admitting: Cardiology

## 2015-03-10 MED ORDER — CARVEDILOL 25 MG PO TABS: 25.0000 mg | ORAL_TABLET | Freq: Two times a day (BID) | ORAL | Status: DC

## 2015-03-10 NOTE — Telephone Encounter (Signed)
Refill submitted to patient's preferred pharmacy. Attempted to notify patient, when dialed goes to message indicating number listed is not in service.

## 2015-03-10 NOTE — Telephone Encounter (Signed)
°  1. Which medications need to be refilled? Carvedilol  2. Which pharmacy is medication to be sent to? [facility] on [address]  3. Do they need a 30 day or 90 day supply? 30(BID)  4. Would they like a call back once the medication has been sent to the pharmacy? Yes

## 2015-04-04 ENCOUNTER — Other Ambulatory Visit: Payer: Self-pay | Admitting: Cardiology

## 2015-04-04 ENCOUNTER — Other Ambulatory Visit: Payer: Self-pay

## 2015-04-04 ENCOUNTER — Ambulatory Visit (HOSPITAL_COMMUNITY): Payer: BC Managed Care – PPO | Attending: Cardiovascular Disease

## 2015-04-04 DIAGNOSIS — I10 Essential (primary) hypertension: Secondary | ICD-10-CM | POA: Insufficient documentation

## 2015-04-04 DIAGNOSIS — E785 Hyperlipidemia, unspecified: Secondary | ICD-10-CM | POA: Insufficient documentation

## 2015-04-04 DIAGNOSIS — I517 Cardiomegaly: Secondary | ICD-10-CM | POA: Diagnosis not present

## 2015-04-04 DIAGNOSIS — I5022 Chronic systolic (congestive) heart failure: Secondary | ICD-10-CM

## 2015-04-04 DIAGNOSIS — I351 Nonrheumatic aortic (valve) insufficiency: Secondary | ICD-10-CM | POA: Insufficient documentation

## 2015-04-20 ENCOUNTER — Ambulatory Visit: Payer: BC Managed Care – PPO | Admitting: Cardiology

## 2015-04-21 ENCOUNTER — Encounter: Payer: Self-pay | Admitting: Cardiology

## 2015-04-21 ENCOUNTER — Ambulatory Visit (INDEPENDENT_AMBULATORY_CARE_PROVIDER_SITE_OTHER): Payer: BC Managed Care – PPO | Admitting: Cardiology

## 2015-04-21 VITALS — BP 140/84 | HR 60 | Ht 70.0 in | Wt 251.5 lb

## 2015-04-21 DIAGNOSIS — I429 Cardiomyopathy, unspecified: Secondary | ICD-10-CM

## 2015-04-21 DIAGNOSIS — I5022 Chronic systolic (congestive) heart failure: Secondary | ICD-10-CM

## 2015-04-21 DIAGNOSIS — I428 Other cardiomyopathies: Secondary | ICD-10-CM

## 2015-04-21 DIAGNOSIS — I10 Essential (primary) hypertension: Secondary | ICD-10-CM | POA: Diagnosis not present

## 2015-04-21 MED ORDER — CARVEDILOL 25 MG PO TABS: 25.0000 mg | ORAL_TABLET | Freq: Two times a day (BID) | ORAL | Status: DC

## 2015-04-21 MED ORDER — LISINOPRIL 20 MG PO TABS: 20.0000 mg | ORAL_TABLET | Freq: Two times a day (BID) | ORAL | Status: DC

## 2015-04-21 NOTE — Progress Notes (Signed)
   HPI The patient presents for followup of his nonischemic cardiomyopathy. He says he feels quite well. 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 There has been no new shortness of breath, PND or orthopnea. There have been no reported palpitations, presyncope or syncope.    No Known Allergies  Current Outpatient Prescriptions  Medication Sig Dispense Refill  . aspirin 81 MG tablet Take 81 mg by mouth daily.    . carvedilol (COREG) 25 MG tablet Take 1 tablet (25 mg total) by mouth 2 (two) times daily with a meal. 60 tablet 2  . lisinopril (PRINIVIL,ZESTRIL) 20 MG tablet Take 1 tablet (20 mg total) by mouth 2 (two) times daily. 60 tablet 3  . furosemide (LASIX) 20 MG tablet Take 1 tablet (20 mg total) by mouth daily. 30 tablet 0   No current facility-administered medications for this visit.    Past Medical History  Diagnosis Date  . Hypertension 2007  . High cholesterol 02/25/2012  . Depression     Previously treated with Citalopram 20mg   . NICM (nonischemic cardiomyopathy) (HCC)     a. Echo 9/13: severe LV dilation, normal wall thickness, EF 15-20%, mild MR, mild LAE, mild RVE, mild reduced RV fxn, mild RAE;  b.  LHC 10/13:  normal coronary arteries; EF 25%,  EF 45% by echo 10/14  . Chronic systolic heart failure (HCC)     a. admx 9/13 (new dx)  . CKD (chronic kidney disease) stage 3, GFR 30-59 ml/min     Creatinine (9/13) 1.6 =>1.5  . NSVT (nonsustained ventricular tachycardia) [facility])     Past Surgical History  Procedure Laterality Date  . No past surgeries      ROS:  As stated in the HPI and negative for all other systems.  PHYSICAL EXAM BP 140/84 mmHg  Pulse 60  Ht 5\' 10"  (1.778 m)  Wt 251 lb 8 oz (114.08 kg)  BMI 36.09 kg/m2 GENERAL:  Well appearing NECK:  No jugular venous distention, waveform within normal limits, carotid upstroke brisk and  symmetric, no bruits, no thyromegaly LUNGS:  Clear to auscultation bilaterally HEART:  PMI not displaced or sustained,S1 and S2 within normal limits, no S3, no S4, no clicks, no rubs, no murmurs ABD:  Flat, positive bowel sounds normal in frequency in pitch, no bruits, no rebound, no guarding, no midline pulsatile mass, no hepatomegaly, no splenomegaly EXT:  2 plus pulses throughout, no edema, no cyanosis no clubbing  EKG:  Sinus rhythm, rate 60, axis within normal limits, intervals within normal limits, poor anterior R wave progression, nonspecific inferior T-wave changes unchanged from previous. 04/21/2015  ASSESSMENT AND PLAN  Chronic Systolic CHF:  He is euvolemic. He did not tolerate spironolactone.  He had hyperkalemia on this. Is otherwise doing well on his medications and these will continue. No change in therapy is indicated.  Non-Ischemic Cardiomyopathy: I checked his ejection fraction recently was up to about 50% which is perhaps a slight improvement. No change in therapy is indicated.  Hypertension:  This is being managed in the context of treating his CHF  Obesity: I congratulated him on losing weight and I encourage more of the same and discuss exercise.  CKD:   His creatinine is stable and actually lower than previous. He will continue with the meds as listed.

## 2015-04-21 NOTE — Patient Instructions (Signed)
Your physician wants you to follow-up in: 1 Year. You will receive a reminder letter in the mail two months in advance. If you don't receive a letter, please call our office to schedule the follow-up appointment.  

## 2015-04-26 ENCOUNTER — Ambulatory Visit (INDEPENDENT_AMBULATORY_CARE_PROVIDER_SITE_OTHER): Payer: BC Managed Care – PPO | Admitting: Internal Medicine

## 2015-04-26 ENCOUNTER — Other Ambulatory Visit (INDEPENDENT_AMBULATORY_CARE_PROVIDER_SITE_OTHER): Payer: BC Managed Care – PPO

## 2015-04-26 VITALS — BP 130/90 | HR 66 | Temp 98.4°F | Ht 70.0 in | Wt 252.0 lb

## 2015-04-26 DIAGNOSIS — Z0000 Encounter for general adult medical examination without abnormal findings: Secondary | ICD-10-CM | POA: Diagnosis not present

## 2015-04-26 DIAGNOSIS — I10 Essential (primary) hypertension: Secondary | ICD-10-CM

## 2015-04-26 LAB — CBC WITH DIFFERENTIAL/PLATELET
BASOS PCT: 1 % (ref 0.0–3.0)
Basophils Absolute: 0.1 10*3/uL (ref 0.0–0.1)
EOS ABS: 0 10*3/uL (ref 0.0–0.7)
Eosinophils Relative: 0.9 % (ref 0.0–5.0)
HEMATOCRIT: 45.6 % (ref 39.0–52.0)
Hemoglobin: 14.8 g/dL (ref 13.0–17.0)
LYMPHS PCT: 24 % (ref 12.0–46.0)
Lymphs Abs: 1.3 10*3/uL (ref 0.7–4.0)
MCHC: 32.5 g/dL (ref 30.0–36.0)
MCV: 81.9 fl (ref 78.0–100.0)
Monocytes Absolute: 0.5 10*3/uL (ref 0.1–1.0)
Monocytes Relative: 8.2 % (ref 3.0–12.0)
NEUTROS ABS: 3.6 10*3/uL (ref 1.4–7.7)
Neutrophils Relative %: 65.9 % (ref 43.0–77.0)
PLATELETS: 163 10*3/uL (ref 150.0–400.0)
RBC: 5.57 Mil/uL (ref 4.22–5.81)
RDW: 15.8 % — AB (ref 11.5–15.5)
WBC: 5.5 10*3/uL (ref 4.0–10.5)

## 2015-04-26 LAB — HEPATIC FUNCTION PANEL
ALT: 17 U/L (ref 0–53)
AST: 16 U/L (ref 0–37)
Albumin: 4.2 g/dL (ref 3.5–5.2)
Alkaline Phosphatase: 92 U/L (ref 39–117)
BILIRUBIN DIRECT: 0 mg/dL (ref 0.0–0.3)
BILIRUBIN TOTAL: 0.4 mg/dL (ref 0.2–1.2)
Total Protein: 7.6 g/dL (ref 6.0–8.3)

## 2015-04-26 LAB — LIPID PANEL
CHOL/HDL RATIO: 4
Cholesterol: 176 mg/dL (ref 0–200)
HDL: 47.7 mg/dL (ref >39.00)
LDL Cholesterol: 113 mg/dL — ABNORMAL HIGH (ref 0–99)
NONHDL: 128.56
Triglycerides: 80 mg/dL (ref 0.0–149.0)
VLDL: 16 mg/dL (ref 0.0–40.0)

## 2015-04-26 LAB — BASIC METABOLIC PANEL
BUN: 17 mg/dL (ref 6–23)
CHLORIDE: 105 meq/L (ref 96–112)
CO2: 31 mEq/L (ref 19–32)
CREATININE: 1.34 mg/dL (ref 0.40–1.50)
Calcium: 9.6 mg/dL (ref 8.4–10.5)
GFR: 72.08 mL/min (ref >60.00)
Glucose, Bld: 94 mg/dL (ref 70–99)
POTASSIUM: 4 meq/L (ref 3.5–5.1)
Sodium: 143 mEq/L (ref 135–145)

## 2015-04-26 LAB — URINALYSIS, ROUTINE W REFLEX MICROSCOPIC
BILIRUBIN URINE: NEGATIVE
KETONES UR: NEGATIVE
LEUKOCYTES UA: NEGATIVE
NITRITE: NEGATIVE
Specific Gravity, Urine: 1.025 (ref 1.000–1.030)
TOTAL PROTEIN, URINE-UPE24: NEGATIVE
URINE GLUCOSE: NEGATIVE
Urobilinogen, UA: 0.2 (ref 0.0–1.0)
pH: 6 (ref 5.0–8.0)

## 2015-04-26 LAB — PSA: PSA: 0.59 ng/mL (ref 0.10–4.00)

## 2015-04-26 LAB — TSH: TSH: 1.55 u[IU]/mL (ref 0.35–4.50)

## 2015-04-26 NOTE — Progress Notes (Signed)
Pre visit review using our clinic review tool, if applicable. No additional management support is needed unless otherwise documented below in the visit note. 

## 2015-04-26 NOTE — Progress Notes (Signed)
Subjective:    Patient ID: Ian Bentley, male    DOB: 21-Dec-1965, 49 y.o.   MRN: [id number]  HPI  Here for wellness and f/u;  Overall doing ok;  Pt denies Chest pain, worsening SOB, DOE, wheezing, orthopnea, PND, worsening LE edema, palpitations, dizziness or syncope.  Pt denies neurological change such as new headache, facial or extremity weakness.  Pt denies polydipsia, polyuria, or low sugar symptoms. Pt states overall good compliance with treatment and medications, good tolerability, and has been trying to follow appropriate diet.  Pt denies worsening depressive symptoms, suicidal ideation or panic. No fever, night sweats, wt loss, loss of appetite, or other constitutional symptoms.  Pt states good ability with ADL's, has low fall risk, home safety reviewed and adequate, no other significant changes in hearing or vision, and only occasionally active with exercise.  Declines flu shot.  No current complaints Past Medical History  Diagnosis Date  . Hypertension 2007  . High cholesterol 02/25/2012  . Depression     Previously treated with Citalopram   . NICM (nonischemic cardiomyopathy) (HCC)     a. Echo 9/13: severe LV dilation, normal wall thickness, EF 15-20%, mild MR, mild LAE, mild RVE, mild reduced RV fxn, mild RAE;  b.  LHC 10/13:  normal coronary arteries; EF 25%,  EF 45% by echo 10/14  . Chronic systolic heart failure (HCC)     a. admx 9/13 (new dx)  . CKD (chronic kidney disease) stage 3, GFR 30-59 ml/min     Creatinine (9/13) 1.6 =>1.5  . NSVT (nonsustained ventricular tachycardia) [facility])    Past Surgical History  Procedure Laterality Date  . No past surgeries      reports that he has never smoked. He has never used smokeless tobacco. He reports that he does not drink alcohol or use illicit drugs. family history includes Cataracts (age of onset: 25) in his mother; Heart disease in an other family member; Hypertension in his daughter; Hypertension (age of onset: 7) in his  mother; Stroke (age of onset: 47) in his mother. There is no history of Sudden death. No Known Allergies Current Outpatient Prescriptions on File Prior to Visit  Medication Sig Dispense Refill  . aspirin 81 MG tablet Take 81 mg by mouth daily.    . carvedilol (COREG) 25 MG tablet Take 1 tablet (25 mg total) by mouth 2 (two) times daily with a meal. 180 tablet 3  . lisinopril (PRINIVIL,ZESTRIL) 20 MG tablet Take 1 tablet (20 mg total) by mouth 2 (two) times daily. 180 tablet 3   No current facility-administered medications on file prior to visit.    Review of Systems Constitutional: Negative for increased diaphoresis, other activity, appetite or siginficant weight change other than noted HENT: Negative for worsening hearing loss, ear pain, facial swelling, mouth sores and neck stiffness.   Eyes: Negative for other worsening pain, redness or visual disturbance.  Respiratory: Negative for shortness of breath and wheezing  Cardiovascular: Negative for chest pain and palpitations.  Gastrointestinal: Negative for diarrhea, blood in stool, abdominal distention or other pain Genitourinary: Negative for hematuria, flank pain or change in urine volume.  Musculoskeletal: Negative for myalgias or other joint complaints.  Skin: Negative for color change and wound or drainage.  Neurological: Negative for syncope and numbness. other than noted Hematological: Negative for adenopathy. or other swelling Psychiatric/Behavioral: Negative for hallucinations, SI, self-injury, decreased concentration or other worsening agitation.      Objective:   Physical Exam BP 130/90  mmHg  Pulse 66  Temp(Src) 98.4 F (36.9 C) (Oral)  Ht 5\' 10"  (1.778 m)  Wt 252 lb (114.306 kg)  BMI 36.16 kg/m2  SpO2 96% VS noted,  Constitutional: Pt is oriented to person, place, and time. Appears well-developed and well-nourished, in no significant distress Head: Normocephalic and atraumatic.  Right Ear: External ear normal.    Left Ear: External ear normal.  Nose: Nose normal.  Mouth/Throat: Oropharynx is clear and moist.  Eyes: Conjunctivae and EOM are normal. Pupils are equal, round, and reactive to light.  Neck: Normal range of motion. Neck supple. No JVD present. No tracheal deviation present or significant neck LA or mass Cardiovascular: Normal rate, regular rhythm, normal heart sounds and intact distal pulses.   Pulmonary/Chest: Effort normal and breath sounds without rales or wheezing  Abdominal: Soft. Bowel sounds are normal. NT. No HSM  Musculoskeletal: Normal range of motion. Exhibits no edema.  Lymphadenopathy:  Has no cervical adenopathy.  Neurological: Pt is alert and oriented to person, place, and time. Pt has normal reflexes. No cranial nerve deficit. Motor grossly intact Skin: Skin is warm and dry. No rash noted.  Psychiatric:  Has normal mood and affect. Behavior is normal.      Assessment & Plan:

## 2015-04-26 NOTE — Patient Instructions (Signed)

## 2015-04-29 NOTE — Assessment & Plan Note (Signed)
stable overall by history and exam, recent data reviewed with pt, and pt to continue medical treatment as before,  to f/u any worsening symptoms or concerns BP Readings from Last 3 Encounters:  04/26/15 130/90  04/21/15 140/84  03/14/14 122/62

## 2015-04-29 NOTE — Assessment & Plan Note (Signed)

## 2015-06-12 ENCOUNTER — Encounter: Payer: Self-pay | Admitting: Internal Medicine

## 2016-04-17 ENCOUNTER — Other Ambulatory Visit: Payer: Self-pay | Admitting: Cardiology

## 2016-05-22 NOTE — Progress Notes (Signed)
   HPI The patient presents for followup of his nonischemic cardiomyopathy.   Since I last saw him he has done very well.  The patient denies any new symptoms such as chest discomfort, neck or arm discomfort. There has been no new shortness of breath, PND or orthopnea. There have been no reported palpitations, presyncope or syncope.  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   No Known Allergies  Current Outpatient Prescriptions  Medication Sig Dispense Refill  . aspirin 81 MG tablet Take 81 mg by mouth daily.    . carvedilol (COREG) 25 MG tablet Take 1 tablet (25 mg total) by mouth 2 (two) times daily with a meal. 60 tablet 11  . lisinopril (PRINIVIL,ZESTRIL) 20 MG tablet Take 1 tablet (20 mg total) by mouth 2 (two) times daily. 60 tablet 11   No current facility-administered medications for this visit.     Past Medical History:  Diagnosis Date  . Chronic systolic heart failure (HCC)    a. admx 9/13 (new dx)  . CKD (chronic kidney disease) stage 3, GFR 30-59 ml/min    Creatinine (9/13) 1.6 =>1.5  . Depression    Previously treated with Citalopram 20mg   . High cholesterol 02/25/2012  . Hypertension 2007  . NICM (nonischemic cardiomyopathy) (HCC)    a. Echo 9/13: severe LV dilation, normal wall thickness, EF 15-20%, mild MR, mild LAE, mild RVE, mild reduced RV fxn, mild RAE;  b.  LHC 10/13:  normal coronary arteries; EF 25%,  EF 45% by echo 10/16  . NSVT (nonsustained ventricular tachycardia) (HCC)     Past Surgical History:  Procedure Laterality Date  . NO PAST SURGERIES      ROS:    As stated in the HPI and negative for all other systems.  PHYSICAL EXAM BP 134/86   Pulse 63   Ht 5\' 10"  (1.778 m)   Wt 260 lb (117.9 kg)   BMI 37.31 kg/m  GENERAL:  Well appearing NECK:  No jugular venous distention, waveform within normal limits, carotid upstroke brisk and symmetric, no bruits, no thyromegaly LUNGS:  Clear to auscultation bilaterally HEART:  PMI not displaced or  sustained,S1 and S2 within normal limits, no S3, no S4, no clicks, no rubs, no murmurs ABD:  Flat, positive bowel sounds normal in frequency in pitch, no bruits, no rebound, no guarding, no midline pulsatile mass, no hepatomegaly, no splenomegaly EXT:  2 plus pulses throughout, no edema, no cyanosis no clubbing  EKG:  Sinus rhythm, rate 63, axis within normal limits, intervals within normal limits, poor anterior R wave progression, nonspecific inferior T-wave changes unchanged from previous. 05/23/2016   ASSESSMENT AND PLAN  Chronic Systolic CHF:  He is euvolemic. He did not tolerate spironolactone.   He had hyperkalemia on this. Is otherwise doing well on his medications and these will continue. No change in therapy is indicated.  Non-Ischemic Cardiomyopathy: I checked his ejection fraction recently was up to about 50% last year.  No change in therapy is indicated.  Hypertension:  This is being managed in the context of treating his CHF  Obesity: At the last visit he had lost weight.  However, weight is up.  The patient understands the need to lose weight with diet and exercise. We have discussed specific strategies for this.  CKD:   His creatinine is to be followed by Oliver Barre, MD

## 2016-05-23 ENCOUNTER — Ambulatory Visit (INDEPENDENT_AMBULATORY_CARE_PROVIDER_SITE_OTHER): Payer: BC Managed Care – PPO | Admitting: Cardiology

## 2016-05-23 ENCOUNTER — Encounter: Payer: Self-pay | Admitting: Cardiology

## 2016-05-23 VITALS — BP 134/86 | HR 63 | Ht 70.0 in | Wt 260.0 lb

## 2016-05-23 DIAGNOSIS — I428 Other cardiomyopathies: Secondary | ICD-10-CM

## 2016-05-23 DIAGNOSIS — I10 Essential (primary) hypertension: Secondary | ICD-10-CM | POA: Diagnosis not present

## 2016-05-23 DIAGNOSIS — I5022 Chronic systolic (congestive) heart failure: Secondary | ICD-10-CM | POA: Diagnosis not present

## 2016-05-23 MED ORDER — LISINOPRIL 20 MG PO TABS: 20.0000 mg | ORAL_TABLET | Freq: Two times a day (BID) | ORAL | 11 refills | Status: DC

## 2016-05-23 MED ORDER — CARVEDILOL 25 MG PO TABS: 25.0000 mg | ORAL_TABLET | Freq: Two times a day (BID) | ORAL | 11 refills | Status: DC

## 2016-05-23 NOTE — Patient Instructions (Signed)

## 2017-05-12 DIAGNOSIS — F32A Depression, unspecified: Secondary | ICD-10-CM | POA: Insufficient documentation

## 2017-05-12 DIAGNOSIS — I4729 Other ventricular tachycardia: Secondary | ICD-10-CM | POA: Insufficient documentation

## 2017-05-12 DIAGNOSIS — F329 Major depressive disorder, single episode, unspecified: Secondary | ICD-10-CM | POA: Insufficient documentation

## 2017-05-12 DIAGNOSIS — I472 Ventricular tachycardia: Secondary | ICD-10-CM | POA: Insufficient documentation

## 2017-05-22 NOTE — Progress Notes (Signed)
    HPI The patient presents for followup of his nonischemic cardiomyopathy.   Since I last saw him he has done well.  The patient denies any new symptoms such as chest discomfort, neck or arm discomfort. There has been no new shortness of breath, PND or orthopnea. There have been no reported palpitations, presyncope or syncope.  He is not as active as I would like because he has had some discomfort in his Achilles tendons.  No Known Allergies  Current Outpatient Medications  Medication Sig Dispense Refill  . aspirin 81 MG tablet Take 81 mg by mouth daily.    . carvedilol (COREG) 25 MG tablet Take 1 tablet (25 mg total) by mouth 2 (two) times daily with a meal. 60 tablet 11  . lisinopril (PRINIVIL,ZESTRIL) 20 MG tablet Take 1 tablet (20 mg total) by mouth 2 (two) times daily. 60 tablet 11   No current facility-administered medications for this visit.     Past Medical History:  Diagnosis Date  . Chronic systolic heart failure (HCC)    a. admx 9/13 (new dx)  . CKD (chronic kidney disease) stage 3, GFR 30-59 ml/min (HCC)    Creatinine (9/13) 1.6 =>1.5  . Depression    Previously treated with Citalopram 20mg   . High cholesterol 02/25/2012  . Hypertension 2007  . NICM (nonischemic cardiomyopathy) (HCC)    a. Echo 9/13: severe LV dilation, normal wall thickness, EF 15-20%, mild MR, mild LAE, mild RVE, mild reduced RV fxn, mild RAE;  b.  LHC 10/13:  normal coronary arteries; EF 25%,  EF 45% by echo 10/16  . NSVT (nonsustained ventricular tachycardia) (HCC)     Past Surgical History:  Procedure Laterality Date  . NO PAST SURGERIES      ROS:    As stated in the HPI and negative for all other systems.  PHYSICAL EXAM BP 132/86   Pulse 78   Ht 5\' 10"  (1.778 m)   Wt 260 lb 9.6 oz (118.2 kg)   BMI 37.39 kg/m   GENERAL:  Well appearing NECK:  No jugular venous distention, waveform within normal limits, carotid upstroke brisk and symmetric, no bruits, no thyromegaly LUNGS:  Clear to  auscultation bilaterally CHEST:  Unremarkable HEART:  PMI not displaced or sustained,S1 and S2 within normal limits, no S3, no S4, no clicks, no rubs, no murmurs ABD:  Flat, positive bowel sounds normal in frequency in pitch, no bruits, no rebound, no guarding, no midline pulsatile mass, no hepatomegaly, no splenomegaly EXT:  2 plus pulses throughout, no edema, no cyanosis no clubbing   EKG:  Sinus rhythm, rate 78, axis within normal limits, intervals within normal limits, poor anterior R wave progression, nonspecific inferior T-wave changes slightly more pronounced than previous. June 21, 2017   ASSESSMENT AND PLAN  Chronic Systolic CHF:  Is been a couple of years since his last echocardiogram.  I am going to check this.  He did not tolerate Spironolactone in the past.  At this point I will likely continue the meds as listed provided his ejection fraction is the same as previous.  He has no new symptoms.   Non-Ischemic Cardiomyopathy: As above.   Hypertension:  This is being managed in the context of treating his CHF  Obesity: His weight is going up.  The patient understands the need to lose weight with diet and exercise. We have discussed specific strategies for this.    CKD:   I will check a BMET.

## 2017-05-23 ENCOUNTER — Ambulatory Visit: Payer: BC Managed Care – PPO | Admitting: Cardiology

## 2017-05-23 ENCOUNTER — Encounter: Payer: Self-pay | Admitting: Cardiology

## 2017-05-23 VITALS — BP 132/86 | HR 78 | Ht 70.0 in | Wt 260.6 lb

## 2017-05-23 DIAGNOSIS — I10 Essential (primary) hypertension: Secondary | ICD-10-CM

## 2017-05-23 DIAGNOSIS — N182 Chronic kidney disease, stage 2 (mild): Secondary | ICD-10-CM | POA: Diagnosis not present

## 2017-05-23 DIAGNOSIS — I428 Other cardiomyopathies: Secondary | ICD-10-CM | POA: Diagnosis not present

## 2017-05-23 DIAGNOSIS — E669 Obesity, unspecified: Secondary | ICD-10-CM | POA: Diagnosis not present

## 2017-05-23 LAB — BASIC METABOLIC PANEL
BUN/Creatinine Ratio: 10 (ref 9–20)
BUN: 14 mg/dL (ref 6–24)
CO2: 26 mmol/L (ref 20–29)
CREATININE: 1.47 mg/dL — AB (ref 0.76–1.27)
Calcium: 9.2 mg/dL (ref 8.7–10.2)
Chloride: 102 mmol/L (ref 96–106)
GFR calc Af Amer: 62 mL/min/{1.73_m2} (ref >59)
GFR, EST NON AFRICAN AMERICAN: 54 mL/min/{1.73_m2} — AB (ref >59)
Glucose: 104 mg/dL — ABNORMAL HIGH (ref 65–99)
Potassium: 4.1 mmol/L (ref 3.5–5.2)
SODIUM: 142 mmol/L (ref 134–144)

## 2017-05-23 LAB — CBC
HEMOGLOBIN: 14.6 g/dL (ref 13.0–17.7)
Hematocrit: 43.6 % (ref 37.5–51.0)
MCH: 27.7 pg (ref 26.6–33.0)
MCHC: 33.5 g/dL (ref 31.5–35.7)
MCV: 83 fL (ref 79–97)
PLATELETS: 171 10*3/uL (ref 150–379)
RBC: 5.28 x10E6/uL (ref 4.14–5.80)
RDW: 16 % — ABNORMAL HIGH (ref 12.3–15.4)
WBC: 4.9 10*3/uL (ref 3.4–10.8)

## 2017-05-23 NOTE — Patient Instructions (Signed)
Medication Instructions:  Continue current medications  If you need a refill on your cardiac medications before your next appointment, please call your pharmacy.  Labwork: BMP and CBC HERE IN OUR OFFICE AT [facility]  Take the provided lab slips for you to take with you to the lab for you blood draw.   You will NOT need to fast   You may go to any [facility] lab that is convenient for you however, we do have a lab in our office that is able to assist you. You do NOT need an appointment for our lab. Once in our office lobby there is a podium to the right of the check-in desk where you are to sign-in and ring a doorbell to alert [place] you are here. Lab is open Monday-Friday from 8:00am to 4:00pm; and is closed for lunch from 12:45p-1:45pm   Testing/Procedures: Your physician has requested that you have an echocardiogram. Echocardiography is a painless test that uses sound waves to create images of your heart. It provides your doctor with information about the size and shape of your heart and how well your heart's chambers and valves are working. This procedure takes approximately one hour. There are no restrictions for this procedure.  Follow-Up: Your physician wants you to follow-up in: 1 Year. You should receive a reminder letter in the mail two months in advance. If you do not receive a letter, please call our office [phone].    Thank you for choosing [facility] at [facility]!!

## 2017-05-23 NOTE — Addendum Note (Signed)
Addended by: Barrie Dunker on: 05/23/2017 11:02 AM   Modules accepted: Orders

## 2017-05-25 ENCOUNTER — Other Ambulatory Visit: Payer: Self-pay | Admitting: Cardiology

## 2017-05-27 ENCOUNTER — Other Ambulatory Visit: Payer: Self-pay

## 2017-05-27 ENCOUNTER — Ambulatory Visit (HOSPITAL_COMMUNITY): Payer: BC Managed Care – PPO | Attending: Cardiology

## 2017-05-27 DIAGNOSIS — E669 Obesity, unspecified: Secondary | ICD-10-CM | POA: Diagnosis not present

## 2017-05-27 DIAGNOSIS — I110 Hypertensive heart disease with heart failure: Secondary | ICD-10-CM | POA: Diagnosis not present

## 2017-05-27 DIAGNOSIS — Z6837 Body mass index (BMI) 37.0-37.9, adult: Secondary | ICD-10-CM | POA: Diagnosis not present

## 2017-05-27 DIAGNOSIS — I428 Other cardiomyopathies: Secondary | ICD-10-CM | POA: Diagnosis present

## 2017-05-27 DIAGNOSIS — E785 Hyperlipidemia, unspecified: Secondary | ICD-10-CM | POA: Diagnosis not present

## 2017-05-27 DIAGNOSIS — I509 Heart failure, unspecified: Secondary | ICD-10-CM | POA: Insufficient documentation

## 2017-06-05 ENCOUNTER — Telehealth: Payer: Self-pay | Admitting: *Deleted

## 2017-06-05 NOTE — Telephone Encounter (Signed)
-----   Message from Rollene Rotunda, MD sent at 05/30/2017  1:29 AM EST ----- EF is about the same.  No change in therapy.  Call Mr. Huwe with the results and send results to Corwin Levins, MD

## 2017-06-05 NOTE — Telephone Encounter (Signed)
Left a message to call back for results

## 2017-06-16 ENCOUNTER — Encounter: Payer: Self-pay | Admitting: *Deleted

## 2018-06-01 NOTE — Progress Notes (Signed)
HPI The patient presents for followup of his nonischemic cardiomyopathy.   Since I last saw him he is done well.  He is very active during the summer pushing a mower.  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 The patient denies any new symptoms such as chest discomfort, neck or arm discomfort. There has been no new shortness of breath, PND or orthopnea. There have been no reported palpitations, presyncope or syncope.    No Known Allergies  Current Outpatient Medications  Medication Sig Dispense Refill  . aspirin 81 MG tablet Take 81 mg by mouth daily.    . carvedilol (COREG) 25 MG tablet Take 1 tablet (25 mg total) by mouth 2 (two) times daily with a meal. 90 tablet 3  . lisinopril (PRINIVIL,ZESTRIL) 20 MG tablet Take 1 tablet (20 mg total) by mouth 2 (two) times daily. 10 tablet 0   No current facility-administered medications for this visit.     Past Medical History:  Diagnosis Date  . Chronic systolic heart failure (HCC)    a. admx 9/13 (new dx)  . CKD (chronic kidney disease) stage 3, GFR 30-59 ml/min (HCC)    Creatinine (9/13) 1.6 =>1.5  . Depression    Previously treated with Citalopram 20mg   . High cholesterol 02/25/2012  . Hypertension 2007  . NICM (nonischemic cardiomyopathy) (HCC)    a. Echo 9/13: severe LV dilation, normal wall thickness, EF 15-20%, mild MR, mild LAE, mild RVE, mild reduced RV fxn, mild RAE;  b.  LHC 10/13:  normal coronary arteries; EF 25%,  EF 45% by echo 10/16  . NSVT (nonsustained ventricular tachycardia) (HCC)     Past Surgical History:  Procedure Laterality Date  . NO PAST SURGERIES      ROS:    As stated in the HPI and negative for all other systems.   PHYSICAL EXAM BP (!) 132/92   Pulse (!) 53   Ht 5\' 10"  (1.778 m)   Wt 245 lb 12.8 oz (111.5 kg)   BMI 35.27 kg/m   GENERAL:  Well appearing NECK:  No jugular venous distention, waveform within normal limits, carotid upstroke brisk and symmetric, no bruits, no  thyromegaly LUNGS:  Clear to auscultation bilaterally CHEST:  Unremarkable HEART:  PMI not displaced or sustained,S1 and S2 within normal limits, no S3, no S4, no clicks, no rubs, no murmurs ABD:  Flat, positive bowel sounds normal in frequency in pitch, no bruits, no rebound, no guarding, no midline pulsatile mass, no hepatomegaly, no splenomegaly EXT:  2 plus pulses throughout, no edema, no cyanosis no clubbing   EKG:  Sinus rhythm, rate 53, axis within normal limits, intervals within normal limits, poor anterior R wave progression, nonspecific ST-T wave changes. 06/05/2018   ASSESSMENT AND PLAN  Chronic Systolic CHF:  The EF was 45%.   This was unchanged last year on echo.  I would like to switch him to [facility].  I will pick a 49/51 mg dose and bring him back in a couple of weeks to titrate up.  He was given instructions about stopping his ACE inhibitor 36 hours before.  He will remain on other meds.   Hypertension:  This is being managed in the context of treating his CHF  Obesity: His weights come down about 15 pounds.  I congratulated him on this.  We discussed more of the same and I gave him specific instructions.  CKD:   Creat was 1.47.  I would like him to get his creatinine repeated when he comes back in a couple of weeks.

## 2018-06-02 ENCOUNTER — Other Ambulatory Visit: Payer: Self-pay | Admitting: *Deleted

## 2018-06-02 MED ORDER — CARVEDILOL 25 MG PO TABS: 25.0000 mg | ORAL_TABLET | Freq: Two times a day (BID) | ORAL | 0 refills | Status: DC

## 2018-06-02 MED ORDER — LISINOPRIL 20 MG PO TABS: 20.0000 mg | ORAL_TABLET | Freq: Two times a day (BID) | ORAL | 0 refills | Status: DC

## 2018-06-05 ENCOUNTER — Encounter: Payer: Self-pay | Admitting: Cardiology

## 2018-06-05 ENCOUNTER — Ambulatory Visit: Payer: BC Managed Care – PPO | Admitting: Cardiology

## 2018-06-05 VITALS — BP 132/92 | HR 53 | Ht 70.0 in | Wt 245.8 lb

## 2018-06-05 DIAGNOSIS — I5022 Chronic systolic (congestive) heart failure: Secondary | ICD-10-CM

## 2018-06-05 DIAGNOSIS — Z79899 Other long term (current) drug therapy: Secondary | ICD-10-CM

## 2018-06-05 MED ORDER — CARVEDILOL 25 MG PO TABS: 25.0000 mg | ORAL_TABLET | Freq: Two times a day (BID) | ORAL | 3 refills | Status: DC

## 2018-06-05 NOTE — Patient Instructions (Signed)
Medication Instructions:  START- Entresto 49/51 mg 1 tablets twice a day STOP-Lisinopril 36 hours before starting Entresto  If you need a refill on your cardiac medications before your next appointment, please call your pharmacy.  Labwork: BMP 2 weeks  If you have labs (blood work) drawn today and your tests are completely normal, you will receive your results only by MyChart Message (if you have MyChart) -OR- A paper copy in the mail.  If you have any lab test that is abnormal or we need to change your treatment, we will call you to review these results.  Testing/Procedures: None Ordered  Follow-Up: . Your physician recommends that you schedule a follow-up appointment in:  2 weeks with Pharmacist  At [facility], you and your health needs are our priority.  As part of our continuing mission to provide you with exceptional heart care, we have created designated Provider Care Teams.  These Care Teams include your primary Cardiologist (physician) and Advanced Practice Providers (APPs -  Physician Assistants and Nurse Practitioners) who all work together to provide you with the care you need, when you need it.

## 2018-06-19 ENCOUNTER — Ambulatory Visit (INDEPENDENT_AMBULATORY_CARE_PROVIDER_SITE_OTHER): Payer: BC Managed Care – PPO | Admitting: Pharmacist

## 2018-06-19 VITALS — BP 130/82 | HR 64 | Ht 70.0 in | Wt 249.4 lb

## 2018-06-19 DIAGNOSIS — I5022 Chronic systolic (congestive) heart failure: Secondary | ICD-10-CM | POA: Diagnosis not present

## 2018-06-19 MED ORDER — SACUBITRIL-VALSARTAN 97-103 MG PO TABS: 1.0000 | ORAL_TABLET | Freq: Two times a day (BID) | ORAL | 0 refills | Status: DC

## 2018-06-19 NOTE — Progress Notes (Signed)
Patient ID: KENDRIK GUMM                 DOB: 01/30/1966                      MRN: [id number]     HPI: Ian Bentley is a 52 y.o. male referred by Dr. Antoine Poche to pharmacists clinic for medication titration. PMH includes heart failure with EF 45% on 05/27/2017, CKD, depression, hypertension, NSVT, and hyperlipidemia. He has a very physical job and denies problems with current therapy. Dr Antoine Poche discontinue his lisinopril during OV on 12/13 and Entresto was started 36 hours after last lisinopril dose. Patient eports increase diuresis 2-3 days after initiating Entresto but now resolve and no problems noted since.   Current HTN meds:  Entresto 49/51 mg twice daily Carvedilol 25mg  twice daily  BP goal: <130/80  Social History: denies tobacco or alcohol use  Diet: low sodium  diet  Exercise:  physically demanding job  Home BP readings: none provided  Wt Readings from Last 3 Encounters:  06/19/18 249 lb 6.4 oz (113.1 kg)  06/05/18 245 lb 12.8 oz (111.5 kg)  05/23/17 260 lb 9.6 oz (118.2 kg)   BP Readings from Last 3 Encounters:  06/19/18 130/82  06/05/18 (!) 132/92  05/23/17 132/86   Pulse Readings from Last 3 Encounters:  06/19/18 64  06/05/18 (!) 53  05/23/17 78    Renal function: Estimated Creatinine Clearance: 73.8 mL/min (A) (by C-G formula based on SCr of 1.44 mg/dL (H)).  Past Medical History:  Diagnosis Date  . Chronic systolic heart failure (HCC)    a. admx 9/13 (new dx)  . CKD (chronic kidney disease) stage 3, GFR 30-59 ml/min (HCC)    Creatinine (9/13) 1.6 =>1.5  . Depression    Previously treated with Citalopram 20mg   . High cholesterol 02/25/2012  . Hypertension 2007  . NICM (nonischemic cardiomyopathy) (HCC)    a. Echo 9/13: severe LV dilation, normal wall thickness, EF 15-20%, mild MR, mild LAE, mild RVE, mild reduced RV fxn, mild RAE;  b.  LHC 10/13:  normal coronary arteries; EF 25%,  EF 45% by echo 10/16  . NSVT (nonsustained ventricular  tachycardia) (HCC)     Current Outpatient Medications on File Prior to Visit  Medication Sig Dispense Refill  . aspirin 81 MG tablet Take 81 mg by mouth daily.    . carvedilol (COREG) 25 MG tablet Take 1 tablet (25 mg total) by mouth 2 (two) times daily with a meal. 90 tablet 3   No current facility-administered medications on file prior to visit.     No Known Allergies  Blood pressure 130/82, pulse 64, height 5\' 10"  (1.778 m), weight 249 lb 6.4 oz (113.1 kg).  Chronic systolic heart failure (HCC) Blood pressure remains stable for further Entresto titration. Patient denies ADR or intolerance to mid-dose Entresto. Rx for Entresto 97/103mg  twice daily sent to prefer pharmacy and 30-day free card provided. Will repeat BMET today and pick-up new Entresto dose tomorrow if renal function remains stable. Plan to follow up in 3 weeks for repeat BMET and BP monitoring.   Ian Bentley PharmD, BCPS, CPP [facility] [address] [place] [address] 06/23/2018 10:24 AM

## 2018-06-19 NOTE — Patient Instructions (Signed)
Return for a  follow up appointment in 3-4 weeks  Go to the lab in TODAY  Check your blood pressure at home daily (if able) and keep record of the readings.  Take your BP meds as follows: *INCREASE Entresto to 97/103mg  twice daily* *CONTINUE all other medication as prescribed*  Bring all of your meds, your BP cuff and your record of home blood pressures to your next appointment.  Exercise as you're able, try to walk approximately 30 minutes per day.  Keep salt intake to a minimum, especially watch canned and prepared boxed foods.  Eat more fresh fruits and vegetables and fewer canned items.  Avoid eating in fast food restaurants.    HOW TO TAKE YOUR BLOOD PRESSURE: . Rest 5 minutes before taking your blood pressure. .  Don't smoke or drink caffeinated beverages for at least 30 minutes before. . Take your blood pressure before (not after) you eat. . Sit comfortably with your back supported and both feet on the floor (don't cross your legs). . Elevate your arm to heart level on a table or a desk. . Use the proper sized cuff. It should fit smoothly and snugly around your bare upper arm. There should be enough room to slip a fingertip under the cuff. The bottom edge of the cuff should be 1 inch above the crease of the elbow. . Ideally, take 3 measurements at one sitting and record the average.

## 2018-06-20 LAB — BASIC METABOLIC PANEL
BUN/Creatinine Ratio: 15 (ref 9–20)
BUN: 22 mg/dL (ref 6–24)
CALCIUM: 9.2 mg/dL (ref 8.7–10.2)
CO2: 24 mmol/L (ref 20–29)
Chloride: 104 mmol/L (ref 96–106)
Creatinine, Ser: 1.44 mg/dL — ABNORMAL HIGH (ref 0.76–1.27)
GFR calc Af Amer: 63 mL/min/{1.73_m2} (ref >59)
GFR calc non Af Amer: 55 mL/min/{1.73_m2} — ABNORMAL LOW (ref >59)
Glucose: 89 mg/dL (ref 65–99)
Potassium: 4.6 mmol/L (ref 3.5–5.2)
Sodium: 143 mmol/L (ref 134–144)

## 2018-06-23 ENCOUNTER — Encounter: Payer: Self-pay | Admitting: Pharmacist

## 2018-06-23 NOTE — Assessment & Plan Note (Signed)
Blood pressure remains stable for further Entresto titration. Patient denies ADR or intolerance to mid-dose Entresto. Rx for Entresto 97/103mg  twice daily sent to [facility] pharmacy and 30-day free card provided. Will repeat BMET today and pick-up new Entresto dose tomorrow if renal function remains stable. Plan to follow up in 3 weeks for repeat BMET and BP monitoring.

## 2018-07-14 ENCOUNTER — Ambulatory Visit (INDEPENDENT_AMBULATORY_CARE_PROVIDER_SITE_OTHER): Payer: BC Managed Care – PPO | Admitting: Pharmacist Clinician (PhC)/ Clinical Pharmacy Specialist

## 2018-07-14 DIAGNOSIS — I5022 Chronic systolic (congestive) heart failure: Secondary | ICD-10-CM | POA: Diagnosis not present

## 2018-07-14 NOTE — Progress Notes (Signed)
Patient ID: JATARIUS HARRIEL                 DOB: [birth year]                      MRN: [id number]     HPI: Ian Bentley is a 53 y.o. male referred by Dr. Antoine Poche to pharmacists clinic for medication titration. PMH includes heart failure with EF 45% on 05/27/2017, CKD, depression, hypertension, NSVT, and hyperlipidemia. 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 Dr Antoine Poche discontinue his lisinopril during OV on 12/13 and Entresto was started 36 hours after last lisinopril dose. Patient reported an increase in diuresis 2-3 days after initiating Entresto but resolved without issue.   Today he returns for follow up, now on the 97/103 mg dose of Entresto.  He has no complaints of chest pain, shortness of breath, edema, dizziness or lightheadedness.    Current HTN meds:  Entresto 97/103 mg twice daily Carvedilol 25mg  twice daily  BP goal: <130/80  Social History: denies tobacco or alcohol use  Diet: low sodium  diet  Exercise:  No regular exercise   Home BP readings: none provided - brought home cuff (wrist model) in today.  Read < 10 points lower on systolic and > 20 points higher on diastolic.  Advised he not use this.    Labs: 06/19/18: Na 143, K 4.6, Glu 89, BUN 22, SCr 1.44  05/23/17:  Na 142, K 4.1, Glu 104, BUN 14, SCr 1.34  Wt Readings from Last 3 Encounters:  06/19/18 249 lb 6.4 oz (113.1 kg)  06/05/18 245 lb 12.8 oz (111.5 kg)  05/23/17 260 lb 9.6 oz (118.2 kg)   BP Readings from Last 3 Encounters:  07/14/18 128/84  06/19/18 130/82  06/05/18 (!) 132/92   Pulse Readings from Last 3 Encounters:  07/14/18 60  06/19/18 64  06/05/18 (!) 53    Past Medical History:  Diagnosis Date  . Chronic systolic heart failure (HCC)    a. admx 9/13 (new dx)  . CKD (chronic kidney disease) stage 3, GFR 30-59 ml/min (HCC)    Creatinine (9/13) 1.6 =>1.5  . Depression    Previously treated with Citalopram 20mg   . High cholesterol 02/25/2012  .  Hypertension 2007  . NICM (nonischemic cardiomyopathy) (HCC)    a. Echo 9/13: severe LV dilation, normal wall thickness, EF 15-20%, mild MR, mild LAE, mild RVE, mild reduced RV fxn, mild RAE;  b.  LHC 10/13:  normal coronary arteries; EF 25%,  EF 45% by echo 10/16  . NSVT (nonsustained ventricular tachycardia) (HCC)     Current Outpatient Medications on File Prior to Visit  Medication Sig Dispense Refill  . aspirin 81 MG tablet Take 81 mg by mouth daily.    . carvedilol (COREG) 25 MG tablet Take 1 tablet (25 mg total) by mouth 2 (two) times daily with a meal. 90 tablet 3  . sacubitril-valsartan (ENTRESTO) 97-103 MG Take 1 tablet by mouth 2 (two) times daily. 60 tablet 0   No current facility-administered medications on file prior to visit.     No Known Allergies  Blood pressure 128/84, pulse 60.  Chronic systolic heart failure (HCC) Patient with HFrEF (EF 45% in 2018) now on full doses of Entresto and carvedilol.  He reports feeling well, no concerns or complaints today.  He is to continue with all current medications and knows to call the office should he  have any concerns or questions.    Phillips Hay PharmD CPP [facility] [address] [place] [address] 07/14/2018 12:00 PM

## 2018-07-14 NOTE — Patient Instructions (Signed)
  Your blood pressure today is 128/84  Take your HF meds as follows:  Continue with all current medications  Bring all of your meds, your BP cuff and your record of home blood pressures to your next appointment.  Exercise as you're able, try to walk approximately 30 minutes per day.  Keep salt intake to a minimum, especially watch canned and prepared boxed foods.  Eat more fresh fruits and vegetables and fewer canned items.  Avoid eating in fast food restaurants.    HOW TO TAKE YOUR BLOOD PRESSURE: . Rest 5 minutes before taking your blood pressure. .  Don't smoke or drink caffeinated beverages for at least 30 minutes before. . Take your blood pressure before (not after) you eat. . Sit comfortably with your back supported and both feet on the floor (don't cross your legs). . Elevate your arm to heart level on a table or a desk. . Use the proper sized cuff. It should fit smoothly and snugly around your bare upper arm. There should be enough room to slip a fingertip under the cuff. The bottom edge of the cuff should be 1 inch above the crease of the elbow. . Ideally, take 3 measurements at one sitting and record the average.

## 2018-07-14 NOTE — Assessment & Plan Note (Signed)
Patient with HFrEF (EF 45% in 2018) now on full doses of Entresto and carvedilol.  He reports feeling well, no concerns or complaints today.  He is to continue with all current medications and knows to call the office should he have any concerns or questions.

## 2018-07-20 ENCOUNTER — Other Ambulatory Visit: Payer: Self-pay

## 2018-07-20 MED ORDER — SACUBITRIL-VALSARTAN 97-103 MG PO TABS: 1.0000 | ORAL_TABLET | Freq: Two times a day (BID) | ORAL | 6 refills | Status: DC

## 2018-07-22 ENCOUNTER — Other Ambulatory Visit: Payer: Self-pay | Admitting: *Deleted

## 2018-07-22 MED ORDER — SACUBITRIL-VALSARTAN 97-103 MG PO TABS: 1.0000 | ORAL_TABLET | Freq: Two times a day (BID) | ORAL | 6 refills | Status: DC

## 2018-07-24 ENCOUNTER — Telehealth: Payer: Self-pay

## 2018-07-24 NOTE — Telephone Encounter (Signed)
Pt called in and explained that he was having issues getting his Entresto refilled. He states he has been to pharmacy and that the insurance will not pay for this medication. He also has the $10 copay card but he is still having difficulty. He would like to be given a call back at [phone]. Thank you

## 2018-07-24 NOTE — Telephone Encounter (Signed)
Please see if our pharmacy has any response for this.

## 2018-07-27 NOTE — Telephone Encounter (Signed)
PA done through covermymeds, awaiting responds

## 2018-08-05 ENCOUNTER — Telehealth: Payer: Self-pay | Admitting: *Deleted

## 2018-08-05 NOTE — Telephone Encounter (Signed)
Unfortunately the insurance company will not cover his Entresto.  We will switch him to 160 mg daily of valsartan and stop the Entresto.

## 2018-08-05 NOTE — Telephone Encounter (Signed)
Prior Auth for pt Ian Bentley was denied, because pt last Echo in 2018 shows an EF of 45%. As per pt insurance provider can appeal decision, please advised?

## 2018-08-07 NOTE — Telephone Encounter (Signed)
Leave message for pt to call back 

## 2018-08-10 NOTE — Telephone Encounter (Signed)
Leave message for pt to call back 

## 2018-08-11 NOTE — Telephone Encounter (Signed)
Leave message for pt to call back 

## 2018-08-14 ENCOUNTER — Other Ambulatory Visit: Payer: Self-pay | Admitting: *Deleted

## 2018-08-14 MED ORDER — VALSARTAN 160 MG PO TABS: 160.0000 mg | ORAL_TABLET | Freq: Every day | ORAL | 3 refills | Status: DC

## 2018-08-14 NOTE — Telephone Encounter (Signed)
Rx has been sent to the pharmacy electronically. Valsartan 160 mg # 90 3 refills

## 2018-08-17 NOTE — Telephone Encounter (Signed)
Nya - looks like you've been trying to reach out to him

## 2018-11-25 MED ORDER — CARVEDILOL 25 MG PO TABS: 25.0000 mg | ORAL_TABLET | Freq: Two times a day (BID) | ORAL | 3 refills | Status: DC

## 2019-08-13 ENCOUNTER — Other Ambulatory Visit: Payer: Self-pay | Admitting: Cardiology

## 2019-10-11 ENCOUNTER — Other Ambulatory Visit: Payer: Self-pay | Admitting: Cardiology

## 2019-11-08 ENCOUNTER — Other Ambulatory Visit: Payer: Self-pay | Admitting: Cardiology

## 2019-11-28 DIAGNOSIS — Z7189 Other specified counseling: Secondary | ICD-10-CM | POA: Insufficient documentation

## 2019-11-28 DIAGNOSIS — N182 Chronic kidney disease, stage 2 (mild): Secondary | ICD-10-CM | POA: Insufficient documentation

## 2019-11-28 NOTE — Progress Notes (Signed)
Cardiology Office Note   Date:  11/29/2019   ID:  Ian Bentley, DOB [birth year], MRN [id number]  PCP:  Biagio Borg, MD  Cardiologist:   Minus Breeding, MD Referring:  Biagio Borg, MD  Chief Complaint  Patient presents with  . Cardiomyopathy      History of Present Illness: Ian Bentley is a 54 y.o. male who presents for followup of his nonischemic cardiomyopathy.   Since I last saw him he has done well.  He does do some active work particularly during the summer.  He denies any acute cardiovascular symptoms. The patient denies any new symptoms such as chest discomfort, neck or arm discomfort. There has been no new shortness of breath, PND or orthopnea. There have been no reported palpitations, presyncope or syncope.     Past Medical History:  Diagnosis Date  . Chronic systolic heart failure (HCC)    a. admx 9/13 (new dx)  . CKD (chronic kidney disease) stage 3, GFR 30-59 ml/min    Creatinine (9/13) 1.6 =>1.5  . Depression    Previously treated with Citalopram 20mg   . High cholesterol 02/25/2012  . Hypertension 2007  . NICM (nonischemic cardiomyopathy) ([place])    a. Echo 9/13: severe LV dilation, normal wall thickness, EF 15-20%, mild MR, mild LAE, mild RVE, mild reduced RV fxn, mild RAE;  b.  LHC 10/13:  normal coronary arteries; EF 25%,  EF 45% by echo 10/16  . NSVT (nonsustained ventricular tachycardia) (HCC)     Past Surgical History:  Procedure Laterality Date  . NO PAST SURGERIES       Current Outpatient Medications  Medication Sig Dispense Refill  . aspirin 81 MG tablet Take 81 mg by mouth daily.    . carvedilol (COREG) 25 MG tablet Take 1 tablet (25 mg total) by mouth 2 (two) times daily with a meal. 180 tablet 3  . cholecalciferol (VITAMIN D3) 25 MCG (1000 UNIT) tablet Take 1,000 Units by mouth daily. Take 1 tablet daily    . valsartan (DIOVAN) 320 MG tablet Take 1 tablet (320 mg total) by mouth daily. 90 tablet 3   No current facility-administered  medications for this visit.    Allergies:   Patient has no known allergies.    ROS:  Please see the history of present illness.   Otherwise, review of systems are positive for poor sleep.   All other systems are reviewed and negative.    PHYSICAL EXAM: VS:  BP (!) 142/98   Pulse 60   Temp (!) 97.4 F (36.3 C)   Ht 5\' 9"  (1.753 m)   Wt 259 lb (117.5 kg)   SpO2 95%   BMI 38.25 kg/m  , BMI Body mass index is 38.25 kg/m. GENERAL:  Well appearing NECK:  No jugular venous distention, waveform within normal limits, carotid upstroke brisk and symmetric, no bruits, no thyromegaly LUNGS:  Clear to auscultation bilaterally CHEST:  Unremarkable HEART:  PMI not displaced or sustained,S1 and S2 within normal limits, no S3, no S4, no clicks, no rubs, no murmurs ABD:  Flat, positive bowel sounds normal in frequency in pitch, no bruits, no rebound, no guarding, no midline pulsatile mass, no hepatomegaly, no splenomegaly EXT:  2 plus pulses throughout, no edema, no cyanosis no clubbing     EKG:  EKG is ordered today. The ekg ordered today demonstrates sinus rhythm, rate 60, axis within normal limits intervals within normal limits, no acute ST-T wave changes.  Recent Labs: No results found for requested labs within last 8760 hours.    Lipid Panel    Component Value Date/Time   CHOL 176 04/26/2015 1053   TRIG 80.0 04/26/2015 1053   HDL 47.70 04/26/2015 1053   CHOLHDL 4 04/26/2015 1053   VLDL 16.0 04/26/2015 1053   LDLCALC 113 (H) 04/26/2015 1053      Wt Readings from Last 3 Encounters:  11/29/19 259 lb (117.5 kg)  06/19/18 249 lb 6.4 oz (113.1 kg)  06/05/18 245 lb 12.8 oz (111.5 kg)      Other studies Reviewed: Additional studies/ records that were reviewed today include: None. Review of the above records demonstrates:  NA   ASSESSMENT AND PLAN:  Chronic Systolic CHF:  The EF was 45% in 2018.  I am going to check another echocardiogram.  I tried to switch him to  [facility] in the past but his insurance refused to cover it.  Therefore, I am going to increase him to valsartan 320 mg daily  Hypertension:  This will be managed in the context of treating his cardiomyopathy.  Obesity: He understands the need to lose weight with diet and exercise.  CKD:   Creat was 1.44 but this was 2019.  I am going to check a basic metabolic profile 2 weeks after increasing his ARB.   Insomnia: I will check a TSH.  I did ask him to discuss it with his primary provider.  Covid education: He has not been vaccinated and we talked about this.  I educated him about the vaccine.   Current medicines are reviewed at length with the patient today.  The patient does not have concerns regarding medicines.  The following changes have been made:  As above  Labs/ tests ordered today include:   Orders Placed This Encounter  Procedures  . Basic metabolic panel  . CBC  . Lipid panel  . TSH  . EKG 12-Lead  . ECHOCARDIOGRAM COMPLETE     Disposition:   FU with me in one year.     Signed, Rollene Rotunda, MD  11/29/2019 10:06 AM    [facility]

## 2019-11-29 ENCOUNTER — Encounter: Payer: Self-pay | Admitting: Cardiology

## 2019-11-29 ENCOUNTER — Other Ambulatory Visit: Payer: Self-pay

## 2019-11-29 ENCOUNTER — Ambulatory Visit: Payer: BC Managed Care – PPO | Admitting: Cardiology

## 2019-11-29 VITALS — BP 142/98 | HR 60 | Temp 97.4°F | Ht 69.0 in | Wt 259.0 lb

## 2019-11-29 DIAGNOSIS — N182 Chronic kidney disease, stage 2 (mild): Secondary | ICD-10-CM | POA: Diagnosis not present

## 2019-11-29 DIAGNOSIS — Z7189 Other specified counseling: Secondary | ICD-10-CM | POA: Diagnosis not present

## 2019-11-29 DIAGNOSIS — I10 Essential (primary) hypertension: Secondary | ICD-10-CM | POA: Diagnosis not present

## 2019-11-29 DIAGNOSIS — I5022 Chronic systolic (congestive) heart failure: Secondary | ICD-10-CM

## 2019-11-29 DIAGNOSIS — I429 Cardiomyopathy, unspecified: Secondary | ICD-10-CM

## 2019-11-29 DIAGNOSIS — E785 Hyperlipidemia, unspecified: Secondary | ICD-10-CM

## 2019-11-29 MED ORDER — VALSARTAN 320 MG PO TABS: 320.0000 mg | ORAL_TABLET | Freq: Every day | ORAL | 3 refills | Status: DC

## 2019-11-29 NOTE — Patient Instructions (Addendum)
Medication Instructions:  INCREASE VALSARTAN TO 320MG  DAILY *If you need a refill on your cardiac medications before your next appointment, please call your pharmacy*  Lab Work: Your physician recommends that you return for lab work on the day of your echo (CBC, TSH, BMP, FASTING LIPIDS) If you have labs (blood work) drawn today and your tests are completely normal, you will receive your results only by: MyChart Message (if you have MyChart) OR . A paper copy in the mail If you have any lab test that is abnormal or we need to change your treatment, we will call you to review the results.  Testing/Procedures: Your physician has requested that you have an echocardiogram in 2 weeks. Echocardiography is a painless test that uses sound waves to create images of your heart. It provides your doctor with information about the size and shape of your heart and how well your heart's chambers and valves are working. This procedure takes approximately one hour. There are no restrictions for this procedure. [address]  Follow-Up: At [facility], you and your health needs are our priority.  As part of our continuing mission to provide you with exceptional heart care, we have created designated Provider Care Teams.  These Care Teams include your primary Cardiologist (physician) and Advanced Practice Providers (APPs -  Physician Assistants and Nurse Practitioners) who all work together to provide you with the care you need, when you need it.  Your next appointment:   12 month(s)   You will receive a reminder letter in the mail two months in advance. If you don't receive a letter, please call our office to schedule the follow-up appointment.  The format for your next appointment:   In Person  Provider:   [facility], MD

## 2019-12-05 ENCOUNTER — Other Ambulatory Visit: Payer: Self-pay | Admitting: Cardiology

## 2019-12-17 ENCOUNTER — Other Ambulatory Visit: Payer: BC Managed Care – PPO | Admitting: *Deleted

## 2019-12-17 ENCOUNTER — Other Ambulatory Visit: Payer: Self-pay

## 2019-12-17 ENCOUNTER — Ambulatory Visit (HOSPITAL_COMMUNITY): Payer: BC Managed Care – PPO | Attending: Cardiology

## 2019-12-17 ENCOUNTER — Other Ambulatory Visit (HOSPITAL_COMMUNITY): Payer: BC Managed Care – PPO

## 2019-12-17 DIAGNOSIS — I429 Cardiomyopathy, unspecified: Secondary | ICD-10-CM | POA: Diagnosis present

## 2019-12-17 LAB — BASIC METABOLIC PANEL
BUN/Creatinine Ratio: 8 — ABNORMAL LOW (ref 9–20)
BUN: 13 mg/dL (ref 6–24)
CO2: 27 mmol/L (ref 20–29)
Calcium: 9.3 mg/dL (ref 8.7–10.2)
Chloride: 102 mmol/L (ref 96–106)
Creatinine, Ser: 1.66 mg/dL — ABNORMAL HIGH (ref 0.76–1.27)
GFR calc Af Amer: 52 mL/min/{1.73_m2} — ABNORMAL LOW (ref >59)
GFR calc non Af Amer: 45 mL/min/{1.73_m2} — ABNORMAL LOW (ref >59)
Glucose: 90 mg/dL (ref 65–99)
Potassium: 4.4 mmol/L (ref 3.5–5.2)
Sodium: 140 mmol/L (ref 134–144)

## 2019-12-17 LAB — LIPID PANEL
Chol/HDL Ratio: 3 ratio (ref 0.0–5.0)
Cholesterol, Total: 169 mg/dL (ref 100–199)
HDL: 56 mg/dL (ref >39)
LDL Chol Calc (NIH): 100 mg/dL — ABNORMAL HIGH (ref 0–99)
Triglycerides: 65 mg/dL (ref 0–149)
VLDL Cholesterol Cal: 13 mg/dL (ref 5–40)

## 2019-12-17 LAB — CBC
Hematocrit: 44.7 % (ref 37.5–51.0)
Hemoglobin: 14.8 g/dL (ref 13.0–17.7)
MCH: 27 pg (ref 26.6–33.0)
MCHC: 33.1 g/dL (ref 31.5–35.7)
MCV: 81 fL (ref 79–97)
Platelets: 141 10*3/uL — ABNORMAL LOW (ref 150–450)
RBC: 5.49 x10E6/uL (ref 4.14–5.80)
RDW: 14.6 % (ref 11.6–15.4)
WBC: 4.4 10*3/uL (ref 3.4–10.8)

## 2019-12-17 LAB — TSH: TSH: 1.39 u[IU]/mL (ref 0.450–4.500)

## 2020-01-08 ENCOUNTER — Other Ambulatory Visit: Payer: Self-pay | Admitting: *Deleted

## 2020-01-08 DIAGNOSIS — N289 Disorder of kidney and ureter, unspecified: Secondary | ICD-10-CM

## 2020-01-08 DIAGNOSIS — I10 Essential (primary) hypertension: Secondary | ICD-10-CM

## 2020-02-21 ENCOUNTER — Encounter: Payer: Self-pay | Admitting: *Deleted

## 2020-10-24 ENCOUNTER — Telehealth: Payer: Self-pay | Admitting: Cardiology

## 2020-10-24 MED ORDER — CARVEDILOL 25 MG PO TABS: 25.0000 mg | ORAL_TABLET | Freq: Two times a day (BID) | ORAL | 3 refills | Status: DC

## 2020-10-24 NOTE — Addendum Note (Signed)
Addended by: Hedda Slade on: 10/24/2020 11:06 AM   Modules accepted: Orders

## 2020-10-24 NOTE — Telephone Encounter (Signed)
*  STAT* If patient is at the pharmacy, call can be transferred to refill team.   1. Which medications need to be refilled? (please list name of each medication and dose if known) carvedilol (COREG) 25 MG tablet  valsartan (DIOVAN) 320 MG tablet  2. Which pharmacy/location (including street and city if local pharmacy) is medication to be sent to? [facility] - [place] - [address]  3. Do they need a 30 day or 90 day supply? 30

## 2020-11-19 ENCOUNTER — Other Ambulatory Visit: Payer: Self-pay | Admitting: Cardiology

## 2020-11-27 NOTE — Progress Notes (Signed)
Cardiology Office Note   Date:  11/28/2020   ID:  Ian Bentley, DOB [birth year], MRN [id number]  PCP:  Corwin Levins, MD  Cardiologist:   Rollene Rotunda, MD   Chief Complaint  Patient presents with  . Cardiomyopathy      History of Present Illness: Ian Bentley is a 55 y.o. male who presents for followup of his nonischemic cardiomyopathy.   Since I last saw him he has done well.  He has had no symptoms.  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  He pushes a lawnmower for 90 minutes at a time. The patient denies any new symptoms such as chest discomfort, neck or arm discomfort. There has been no new shortness of breath, PND or orthopnea. There have been no reported palpitations, presyncope or syncope.     Past Medical History:  Diagnosis Date  . Chronic systolic heart failure (HCC)    a. admx 9/13 (new dx)  . CKD (chronic kidney disease) stage 3, GFR 30-59 ml/min (HCC)    Creatinine (9/13) 1.6 =>1.5  . Depression    Previously treated with Citalopram 20mg   . High cholesterol 02/25/2012  . Hypertension 2007  . NICM (nonischemic cardiomyopathy) (HCC)    a. Echo 9/13: severe LV dilation, normal wall thickness, EF 15-20%, mild MR, mild LAE, mild RVE, mild reduced RV fxn, mild RAE;  b.  LHC 10/13:  normal coronary arteries; EF 25%,  EF 45% by echo 10/16  . NSVT (nonsustained ventricular tachycardia) (HCC)     Past Surgical History:  Procedure Laterality Date  . NO PAST SURGERIES       Current Outpatient Medications  Medication Sig Dispense Refill  . aspirin 81 MG tablet Take 81 mg by mouth daily.    . carvedilol (COREG) 25 MG tablet Take 1 tablet (25 mg total) by mouth 2 (two) times daily with a meal. 180 tablet 3  . cholecalciferol (VITAMIN D3) 25 MCG (1000 UNIT) tablet Take 1,000 Units by mouth daily. Take 1 tablet daily    . sacubitril-valsartan (ENTRESTO) 49-51 MG Take 1 tablet by mouth 2 (two) times daily. 60 tablet 5  . spironolactone  (ALDACTONE) 25 MG tablet Take 1 tablet (25 mg total) by mouth daily. 30 tablet 5   No current facility-administered medications for this visit.    Allergies:   Patient has no known allergies.    ROS:  Please see the history of present illness.   Otherwise, review of systems are positive for insomnia.   All other systems are reviewed and negative.    PHYSICAL EXAM: VS:  BP 138/84   Pulse (!) 58   Ht 5\' 10"  (1.778 m)   Wt 247 lb 12.8 oz (112.4 kg)   SpO2 97%   BMI 35.56 kg/m  , BMI Body mass index is 35.56 kg/m. GENERAL:  Well appearing NECK:  No jugular venous distention, waveform within normal limits, carotid upstroke brisk and symmetric, no bruits, no thyromegaly LUNGS:  Clear to auscultation bilaterally CHEST:  Unremarkable HEART:  PMI not displaced or sustained,S1 and S2 within normal limits, no S3, no S4, no clicks, no rubs, no murmurs ABD:  Flat, positive bowel sounds normal in frequency in pitch, no bruits, no rebound, no guarding, no midline pulsatile mass, no hepatomegaly, no splenomegaly EXT:  2 plus pulses throughout, no edema, no cyanosis no clubbing   EKG:  EKG is  ordered today. The ekg ordered today demonstrates sinus  rhythm, rate 58, axis within normal limits intervals within normal limits, no acute ST-T wave changes.   Recent Labs: 12/17/2019: BUN 13; Creatinine, Ser 1.66; Hemoglobin 14.8; Platelets 141; Potassium 4.4; Sodium 140; TSH 1.390    Lipid Panel    Component Value Date/Time   CHOL 169 12/17/2019 0745   TRIG 65 12/17/2019 0745   HDL 56 12/17/2019 0745   CHOLHDL 3.0 12/17/2019 0745   CHOLHDL 4 04/26/2015 1053   VLDL 16.0 04/26/2015 1053   LDLCALC 100 (H) 12/17/2019 0745      Wt Readings from Last 3 Encounters:  11/28/20 247 lb 12.8 oz (112.4 kg)  11/29/19 259 lb (117.5 kg)  06/19/18 249 lb 6.4 oz (113.1 kg)      Other studies Reviewed: Additional studies/ records that were reviewed today include: Labs. Review of the above records  demonstrates:  See elsewhere   ASSESSMENT AND PLAN:  Chronic Systolic CHF:  The EF was 45% last year.   Sherryll Burger was not approved by his insurance company last year but I Ernie Hew try again this year.  I am going to start 49/51 stopping his ARB.  I am also going to add spironolactone 25 mg daily.  He does have some mild renal insufficiency so I will follow this closely and according to protocol.  I will follow guidelines for spironolactone follow up in heart failure.  (Check potassium levels and  renal function 3--4 days and 1 week, then at least monthly for first 3 months and every 3 months thereafter after initiation of spironolactone.)  Hypertension:  This is being managed in the context of treating his CHF  Obesity: He understands the need to lose weight and we talked about carbohydrates again today.  CKD:   Creat was 1.66.  I will follow this closely as above.     Current medicines are reviewed at length with the patient today.  The patient does not have concerns regarding medicines.  The following changes have been made: As above  Labs/ tests ordered today include:   Orders Placed This Encounter  Procedures  . Basic metabolic panel  . Basic metabolic panel  . EKG 12-Lead     Disposition:   FU with me in 6 months   Signed, Rollene Rotunda, MD  11/28/2020 3:22 PM     [facility]

## 2020-11-28 ENCOUNTER — Encounter: Payer: Self-pay | Admitting: Cardiology

## 2020-11-28 ENCOUNTER — Ambulatory Visit: Payer: BC Managed Care – PPO | Admitting: Cardiology

## 2020-11-28 ENCOUNTER — Other Ambulatory Visit: Payer: Self-pay

## 2020-11-28 VITALS — BP 138/84 | HR 58 | Ht 70.0 in | Wt 247.8 lb

## 2020-11-28 DIAGNOSIS — I10 Essential (primary) hypertension: Secondary | ICD-10-CM | POA: Diagnosis not present

## 2020-11-28 DIAGNOSIS — I5022 Chronic systolic (congestive) heart failure: Secondary | ICD-10-CM | POA: Diagnosis not present

## 2020-11-28 DIAGNOSIS — N182 Chronic kidney disease, stage 2 (mild): Secondary | ICD-10-CM

## 2020-11-28 DIAGNOSIS — Z5181 Encounter for therapeutic drug level monitoring: Secondary | ICD-10-CM

## 2020-11-28 MED ORDER — SACUBITRIL-VALSARTAN 49-51 MG PO TABS: 1.0000 | ORAL_TABLET | Freq: Two times a day (BID) | ORAL | 5 refills | Status: DC

## 2020-11-28 MED ORDER — SPIRONOLACTONE 25 MG PO TABS
25.0000 mg | ORAL_TABLET | Freq: Every day | ORAL | 5 refills | Status: DC
Start: 2020-11-28 — End: 2022-07-19

## 2020-11-28 NOTE — Patient Instructions (Addendum)
Medication Instructions:  STOP VALSARTAN   START ENTRESTO 49-51 MG TWICE A DAY   START SPIRONOLACTONE 25 MG DAILY   *If you need a refill on your cardiac medications before your next appointment, please call your pharmacy*  Lab Work: BMET 3-4 DAYS AFTER STARTING NEW MEDICATIONS  Check BMET  1 week, then at least monthly for first 3 months and every 3 months    If you have labs (blood work) drawn today and your tests are completely normal, you will receive your results only by: Marland Kitchen MyChart Message (if you have MyChart) OR . A paper copy in the mail If you have any lab test that is abnormal or we need to change your treatment, we will call you to review the results.  Testing/Procedures: NONE   Follow-Up: At [facility], you and your health needs are our priority.  As part of our continuing mission to provide you with exceptional heart care, we have created designated Provider Care Teams.  These Care Teams include your primary Cardiologist (physician) and Advanced Practice Providers (APPs -  Physician Assistants and Nurse Practitioners) who all work together to provide you with the care you need, when you need it.  We recommend signing up for the patient portal called "MyChart".  Sign up information is provided on this After Visit Summary.  MyChart is used to connect with patients for Virtual Visits (Telemedicine).  Patients are able to view lab/test results, encounter notes, upcoming appointments, etc.  Non-urgent messages can be sent to your provider as well.   To learn more about what you can do with MyChart, go to [email or web address].    Your next appointment:   6 month(s)  The format for your next appointment:   In Person  Provider:   You may see Rollene Rotunda, MD or one of the following Advanced Practice Providers on your designated Care Team:    Theodore Demark, PA-C  Joni Reining, DNP, ANP

## 2021-01-05 MED ORDER — VALSARTAN 320 MG PO TABS: 320.0000 mg | ORAL_TABLET | Freq: Every day | ORAL | 3 refills | Status: DC

## 2021-09-28 ENCOUNTER — Other Ambulatory Visit: Payer: Self-pay

## 2021-09-28 MED ORDER — CARVEDILOL 25 MG PO TABS: 25.0000 mg | ORAL_TABLET | Freq: Two times a day (BID) | ORAL | 3 refills | Status: DC

## 2021-09-28 MED ORDER — VALSARTAN 320 MG PO TABS: 320.0000 mg | ORAL_TABLET | Freq: Every day | ORAL | 0 refills | Status: DC

## 2021-11-04 ENCOUNTER — Other Ambulatory Visit: Payer: Self-pay | Admitting: Cardiology

## 2022-01-25 ENCOUNTER — Other Ambulatory Visit: Payer: Self-pay | Admitting: Cardiology

## 2022-04-22 ENCOUNTER — Other Ambulatory Visit: Payer: Self-pay | Admitting: Cardiology

## 2022-06-23 ENCOUNTER — Encounter: Payer: Self-pay | Admitting: Cardiology

## 2022-07-18 NOTE — Progress Notes (Signed)
Cardiology Office Note   Date:  07/19/2022   ID:  Rylyn, Ranganathan 06-14-1966, MRN [id number]  PCP:  Biagio Borg, MD  Cardiologist:   Minus Breeding, MD   Chief Complaint  Patient presents with   Cardiomyopathy      History of Present Illness: Ian Bentley is a 57 y.o. male who presents for followup of his nonischemic cardiomyopathy.   Since I last saw him he has done well.  He has not come back in a while but needs his prescription renewals.  He still driving a schoolbus. The patient denies any new symptoms such as chest discomfort, neck or arm discomfort. There has been no new shortness of breath, PND or orthopnea. There have been no reported palpitations, presyncope or syncope.    Past Medical History:  Diagnosis Date   Chronic systolic heart failure ([place])    a. admx 9/13 (new dx)   CKD (chronic kidney disease) stage 3, GFR 30-59 ml/min (HCC)    Creatinine (9/13) 1.6 =>1.5   Depression    Previously treated with Citalopram 20mg    High cholesterol 02/25/2012   Hypertension 2007   NICM (nonischemic cardiomyopathy) ([place])    a. Echo 9/13: severe LV dilation, normal wall thickness, EF 15-20%, mild MR, mild LAE, mild RVE, mild reduced RV fxn, mild RAE;  b.  LHC 10/13:  normal coronary arteries; EF 25%,  EF 45% by echo 10/16   NSVT (nonsustained ventricular tachycardia) (HCC)     Past Surgical History:  Procedure Laterality Date   NO PAST SURGERIES       Current Outpatient Medications  Medication Sig Dispense Refill   aspirin 81 MG tablet Take 81 mg by mouth daily.     carvedilol (COREG) 25 MG tablet Take 1 tablet (25 mg total) by mouth 2 (two) times daily with a meal. 180 tablet 3   sacubitril-valsartan (ENTRESTO) 97-103 MG Take 1 tablet by mouth 2 (two) times daily. 180 tablet 3   No current facility-administered medications for this visit.    Allergies:   Patient has no known allergies.    ROS:  Please see the history of present illness.   Otherwise,  review of systems are positive for none.   All other systems are reviewed and negative.    PHYSICAL EXAM: VS:  BP (!) 138/90 (BP Location: Left Arm, Patient Position: Sitting, Cuff Size: Large)   Pulse 76   Ht 5\' 10"  (1.778 m)   Wt 255 lb (115.7 kg)   BMI 36.59 kg/m  , BMI Body mass index is 36.59 kg/m. GENERAL:  Well appearing NECK:  No jugular venous distention, waveform within normal limits, carotid upstroke brisk and symmetric, no bruits, no thyromegaly LUNGS:  Clear to auscultation bilaterally CHEST:  Unremarkable HEART:  PMI not displaced or sustained,S1 and S2 within normal limits, no S3, no S4, no clicks, no rubs, no murmurs ABD:  Flat, positive bowel sounds normal in frequency in pitch, no bruits, no rebound, no guarding, no midline pulsatile mass, no hepatomegaly, no splenomegaly EXT:  2 plus pulses throughout, no edema, no cyanosis no clubbing   EKG:  EKG is  ordered today. The ekg ordered today demonstrates sinus rhythm, rate 76, axis within normal limits intervals within normal limits, no acute ST-T wave changes.   Recent Labs: No results found for requested labs within last 365 days.    Lipid Panel    Component Value Date/Time   CHOL 169 12/17/2019  0745   TRIG 65 12/17/2019 0745   HDL 56 12/17/2019 0745   CHOLHDL 3.0 12/17/2019 0745   CHOLHDL 4 04/26/2015 1053   VLDL 16.0 04/26/2015 1053   LDLCALC 100 (H) 12/17/2019 0745      Wt Readings from Last 3 Encounters:  07/19/22 255 lb (115.7 kg)  11/28/20 247 lb 12.8 oz (112.4 kg)  11/29/19 259 lb (117.5 kg)      Other studies Reviewed: Additional studies/ records that were reviewed today include: None. Review of the above records demonstrates: N/A   ASSESSMENT AND PLAN:  Chronic Systolic CHF:  The EF was 45% in 2021.  I will check another echocardiogram.  In the past he has been denied Entresto by his insurance company.  I am going to try this again.   Otherwise his meds will stay as listed.  I will be  checking blood work in 2 weeks after changing to [place] and stopping his Diovan.  I will check CBC, TSH because he has not had these drawn and a basic metabolic profile.   Hypertension:  This is being managed in the context of managing his heart failure.    Obesity: We talked about diet again today.   CKD:   Creat was 1.66 but this was a couple years ago.  I will repeat a basic metabolic profile as above.    Current medicines are reviewed at length with the patient today.  The patient does not have concerns regarding medicines.  The following changes have been made: As above  Labs/ tests ordered today include:   Orders Placed This Encounter  Procedures   Basic metabolic panel   TSH   CBC   EKG 12-Lead   ECHOCARDIOGRAM COMPLETE     Disposition:   FU with me in 12 months   Signed, Minus Breeding, MD  07/19/2022 11:34 AM    [place]

## 2022-07-19 ENCOUNTER — Ambulatory Visit: Payer: BC Managed Care – PPO | Attending: Cardiology | Admitting: Cardiology

## 2022-07-19 ENCOUNTER — Encounter: Payer: Self-pay | Admitting: Cardiology

## 2022-07-19 VITALS — BP 138/90 | HR 76 | Ht 70.0 in | Wt 255.0 lb

## 2022-07-19 DIAGNOSIS — I10 Essential (primary) hypertension: Secondary | ICD-10-CM | POA: Diagnosis not present

## 2022-07-19 DIAGNOSIS — N182 Chronic kidney disease, stage 2 (mild): Secondary | ICD-10-CM | POA: Diagnosis not present

## 2022-07-19 DIAGNOSIS — I5022 Chronic systolic (congestive) heart failure: Secondary | ICD-10-CM | POA: Diagnosis not present

## 2022-07-19 MED ORDER — SACUBITRIL-VALSARTAN 97-103 MG PO TABS: 1.0000 | ORAL_TABLET | Freq: Two times a day (BID) | ORAL | 3 refills | Status: DC

## 2022-07-19 NOTE — Patient Instructions (Signed)
Medication Instructions:  STOP valsartan START Entresto 97-103 mg two times daily  *If you need a refill on your cardiac medications before your next appointment, please call your pharmacy*   Lab Work: Please return for labs 2 weeks after starting Entresto (BMET, CBC,TSH)  Our in office lab hours are Monday-Friday 8:00-4:00, closed for lunch 12:45-1:45 pm.  No appointment needed.  [facility] locations:   [place] ([place] - [address] [place] [address] - [address] [place] - [address] Oncologist)  Testing/Procedures: Your physician has requested that you have an echocardiogram. Echocardiography is a painless test that uses sound waves to create images of your heart. It provides your doctor with information about the size and shape of your heart and how well your heart's chambers and valves are working. This procedure takes approximately one hour. There are no restrictions for this procedure. Please do NOT wear cologne, perfume, aftershave, or lotions (deodorant is allowed). Please arrive 15 minutes prior to your appointment time.   Follow-Up: At [facility], you and your health needs are our priority.  As part of our continuing mission to provide you with exceptional heart care, we have created designated Provider Care Teams.  These Care Teams include your primary Cardiologist (physician) and Advanced Practice Providers (APPs -  Physician Assistants and Nurse Practitioners) who all work together to provide you with the care you need, when you need it.  We recommend signing up for the patient portal called "MyChart".  Sign up information is provided on this After Visit Summary.   MyChart is used to connect with patients for Virtual Visits (Telemedicine).  Patients are able to view lab/test results, encounter notes, upcoming appointments, etc.  Non-urgent messages can be sent to your provider as well.   To learn more about what you can do with MyChart, go to [email or web address].    Your next appointment:   12 month(s)  Provider:   Minus Breeding, MD

## 2022-07-22 ENCOUNTER — Encounter: Payer: Self-pay | Admitting: Cardiology

## 2022-07-22 MED ORDER — CARVEDILOL 25 MG PO TABS: 25.0000 mg | ORAL_TABLET | Freq: Two times a day (BID) | ORAL | 3 refills | Status: DC

## 2022-07-23 ENCOUNTER — Telehealth: Payer: Self-pay

## 2022-07-23 NOTE — Telephone Encounter (Signed)
**Note De-Identified Shashana Fullington Obfuscation** STEELE STRACENER ([employer]iana Loft) PA Case ID #: [id number] Outcome: Approved on January 29 Your PA request has been approved. Additional information will be provided in the approval communication. (Message 1145) Authorization Expiration Date: 07/22/2023 Drug Entresto 97-103MG  tablets ePA cloud logo Form [employer] Electronic PA Form (2017 NCPDP)  I have notified [place] ([place]), [place] - [address] (Ph: [phone]) and te pt of this approval.

## 2022-08-12 LAB — CBC
Hematocrit: 47.5 % (ref 37.5–51.0)
Hemoglobin: 15.2 g/dL (ref 13.0–17.7)
MCH: 26.5 pg — ABNORMAL LOW (ref 26.6–33.0)
MCHC: 32 g/dL (ref 31.5–35.7)
MCV: 83 fL (ref 79–97)
Platelets: 143 10*3/uL — ABNORMAL LOW (ref 150–450)
RBC: 5.73 x10E6/uL (ref 4.14–5.80)
RDW: 14.5 % (ref 11.6–15.4)
WBC: 5.8 10*3/uL (ref 3.4–10.8)

## 2022-08-12 LAB — BASIC METABOLIC PANEL
BUN/Creatinine Ratio: 9 (ref 9–20)
BUN: 14 mg/dL (ref 6–24)
CO2: 28 mmol/L (ref 20–29)
Calcium: 9.6 mg/dL (ref 8.7–10.2)
Chloride: 107 mmol/L — ABNORMAL HIGH (ref 96–106)
Creatinine, Ser: 1.57 mg/dL — ABNORMAL HIGH (ref 0.76–1.27)
Glucose: 97 mg/dL (ref 70–99)
Potassium: 4.7 mmol/L (ref 3.5–5.2)
Sodium: 144 mmol/L (ref 134–144)
eGFR: 51 mL/min/{1.73_m2} — ABNORMAL LOW (ref >59)

## 2022-08-12 LAB — TSH: TSH: 1.61 u[IU]/mL (ref 0.450–4.500)

## 2022-08-15 ENCOUNTER — Ambulatory Visit (HOSPITAL_COMMUNITY): Payer: BC Managed Care – PPO | Attending: Cardiology

## 2022-08-15 DIAGNOSIS — I5022 Chronic systolic (congestive) heart failure: Secondary | ICD-10-CM

## 2022-08-15 LAB — ECHOCARDIOGRAM COMPLETE
Area-P 1/2: 4.46 cm2
P 1/2 time: 531 msec
S' Lateral: 3.9 cm

## 2022-08-29 ENCOUNTER — Other Ambulatory Visit: Payer: Self-pay | Admitting: *Deleted

## 2022-08-29 DIAGNOSIS — N182 Chronic kidney disease, stage 2 (mild): Secondary | ICD-10-CM

## 2022-08-30 ENCOUNTER — Encounter: Payer: Self-pay | Admitting: *Deleted

## 2022-10-15 ENCOUNTER — Other Ambulatory Visit: Payer: Self-pay | Admitting: *Deleted

## 2022-10-15 DIAGNOSIS — N182 Chronic kidney disease, stage 2 (mild): Secondary | ICD-10-CM

## 2022-10-28 ENCOUNTER — Encounter: Payer: Self-pay | Admitting: *Deleted

## 2023-07-28 ENCOUNTER — Other Ambulatory Visit: Payer: Self-pay | Admitting: Cardiology

## 2023-07-30 MED ORDER — SACUBITRIL-VALSARTAN 97-103 MG PO TABS: 1.0000 | ORAL_TABLET | Freq: Two times a day (BID) | ORAL | 0 refills | Status: DC

## 2023-07-31 ENCOUNTER — Telehealth: Payer: Self-pay | Admitting: Pharmacy Technician

## 2023-07-31 ENCOUNTER — Other Ambulatory Visit (HOSPITAL_COMMUNITY): Payer: Self-pay

## 2023-07-31 NOTE — Telephone Encounter (Signed)
 Pharmacy Patient Advocate Encounter   Received notification from CoverMyMeds that prior authorization for entresto  is required/requested.   Insurance verification completed.   The patient is insured through [facility] ADVANTAGE/RX ADVANCE .   Per test claim: PA required; PA submitted to above mentioned insurance via CoverMyMeds Key/confirmation #/EOC [id number] Status is pending

## 2023-07-31 NOTE — Telephone Encounter (Signed)
 Pharmacy Patient Advocate Encounter  Received notification from [employer] ADVANCE that Prior Authorization for entresto  has been APPROVED from 07/31/23 to 07/29/24   PA #/Case ID/Reference #: [id number]

## 2023-08-31 ENCOUNTER — Other Ambulatory Visit: Payer: Self-pay | Admitting: Cardiology

## 2023-09-24 NOTE — Progress Notes (Unsigned)
  Cardiology Office Note:   Date:  09/26/2023  ID:  Ian Bentley, DOB 07/07/1965, MRN [id number] PCP: Aviva Kluver   [facility]Care Providers Cardiologist:  Rollene Rotunda, MD {  History of Present Illness:   Ian Bentley is a 58 y.o. male who presents for followup of his nonischemic cardiomyopathy.   Since I last saw him he has been doing well.  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  The patient denies any new symptoms such as chest discomfort, neck or arm discomfort. There has been no new shortness of breath, PND or orthopnea. There have been no reported palpitations, presyncope or syncope.   ROS: As stated in the HPI and negative for all other systems.  Studies Reviewed:    EKG:   EKG Interpretation Date/Time:  Friday September 26 2023 09:55:29 EDT Ventricular Rate:  62 PR Interval:  138 QRS Duration:  106 QT Interval:  390 QTC Calculation: 395 R Axis:   58  Text Interpretation: Normal sinus rhythm Poor anterior R wave progression When compared with ECG of 25-Feb-2012 07:17, No significant change since last tracing Confirmed by Rollene Rotunda ([id number]) on 09/26/2023 10:10:45 AM    Risk Assessment/Calculations:              Physical Exam:   VS:  BP 122/80 (BP Location: Left Arm, Patient Position: Sitting, Cuff Size: Normal)   Pulse 66   Ht 5\' 10"  (1.778 m)   Wt 250 lb 9.6 oz (113.7 kg)   SpO2 95%   BMI 35.96 kg/m    Wt Readings from Last 3 Encounters:  09/26/23 250 lb 9.6 oz (113.7 kg)  07/19/22 255 lb (115.7 kg)  11/28/20 247 lb 12.8 oz (112.4 kg)     GEN: Well nourished, well developed in no acute distress NECK: No JVD; No carotid bruits CARDIAC: RRR, no murmurs, rubs, gallops RESPIRATORY:  Clear to auscultation without rales, wheezing or rhonchi  ABDOMEN: Soft, non-tender, non-distended EXTREMITIES:  No edema; No deformity   ASSESSMENT AND PLAN:   Chronic Systolic CHF:  The EF was 45% last year in 2024.  He had been  less than 20% years ago.  In 2013 he had normal cath.  The etiology has not been clear.  He has been asymptomatic.  He is tolerated the meds as listed but he does have some renal insufficiency.  I will avoid spironolactone and Farxiga as he has renal insufficiency and no symptoms.  I am going to check a PYP scan though I have a very low suspicion of amyloid.  If this is not highly suspicious I will pursue further imaging.   Hypertension:  This is being managed in the context of treating his cardiomyopathy.  Will be checking labs as above.  Obesity: We talked about increasing his exercise which he does not do aerobically in the winter.   CKD:   Creat was 1.57 last June and can repeat this as above.      Follow up with me in one year.   Signed, Rollene Rotunda, MD

## 2023-09-26 ENCOUNTER — Encounter: Payer: Self-pay | Admitting: Cardiology

## 2023-09-26 ENCOUNTER — Ambulatory Visit: Payer: Self-pay | Attending: Cardiology | Admitting: Cardiology

## 2023-09-26 VITALS — BP 122/80 | HR 66 | Ht 70.0 in | Wt 250.6 lb

## 2023-09-26 DIAGNOSIS — N182 Chronic kidney disease, stage 2 (mild): Secondary | ICD-10-CM | POA: Diagnosis not present

## 2023-09-26 DIAGNOSIS — I5022 Chronic systolic (congestive) heart failure: Secondary | ICD-10-CM | POA: Diagnosis not present

## 2023-09-26 DIAGNOSIS — I10 Essential (primary) hypertension: Secondary | ICD-10-CM | POA: Diagnosis not present

## 2023-09-26 NOTE — Patient Instructions (Signed)
 Medication Instructions:  No changes.  *If you need a refill on your cardiac medications before your next appointment, please call your pharmacy*  Lab Work: CMET, TSH, CBC, A1C If you have labs (blood work) drawn today and your tests are completely normal, you will receive your results only by: MyChart Message (if you have MyChart) OR A paper copy in the mail If you have any lab test that is abnormal or we need to change your treatment, we will call you to review the results.  Testing/Procedures: You have been ordered a PYP Scan.  This is done at [facility], they will call you to schedule.  When you come for this test please plan to be there 2-3 hours.  They are located at: [address] [place], [place] [address]   Follow-Up: At [facility], you and your health needs are our priority.  As part of our continuing mission to provide you with exceptional heart care, our providers are all part of one team.  This team includes your primary Cardiologist (physician) and Advanced Practice Providers or APPs (Physician Assistants and Nurse Practitioners) who all work together to provide you with the care you need, when you need it.  Your next appointment:   1 year(s)  Provider:   Rollene Rotunda, MD     We recommend signing up for the patient portal called "MyChart".  Sign up information is provided on this After Visit Summary.  MyChart is used to connect with patients for Virtual Visits (Telemedicine).  Patients are able to view lab/test results, encounter notes, upcoming appointments, etc.  Non-urgent messages can be sent to your provider as well.   To learn more about what you can do with MyChart, go to [email or web address].   Other Instructions       1st Floor: - Lobby - Registration  - Pharmacy  - Lab - Cafe  2nd Floor: - PV Lab - Diagnostic Testing (echo, CT, nuclear med)  3rd Floor: - Vacant  4th Floor: - TCTS (cardiothoracic surgery) - AFib Clinic -  Structural Heart Clinic - Vascular Surgery  - Vascular Ultrasound  5th Floor: - [facility] Cardiology (general and EP) - Clinical Pharmacy for coumadin, hypertension, lipid, weight-loss medications, and med management appointments    Valet parking services will be available as well.

## 2023-09-27 ENCOUNTER — Other Ambulatory Visit: Payer: Self-pay | Admitting: Cardiology

## 2023-09-27 LAB — COMPREHENSIVE METABOLIC PANEL WITH GFR
ALT: 15 IU/L (ref 0–44)
AST: 17 IU/L (ref 0–40)
Albumin: 4.1 g/dL (ref 3.8–4.9)
Alkaline Phosphatase: 88 IU/L (ref 44–121)
BUN/Creatinine Ratio: 10 (ref 10–24)
BUN: 15 mg/dL (ref 8–27)
Bilirubin Total: 0.5 mg/dL (ref 0.0–1.2)
CO2: 25 mmol/L (ref 20–29)
Calcium: 9.3 mg/dL (ref 8.6–10.2)
Chloride: 104 mmol/L (ref 96–106)
Creatinine, Ser: 1.57 mg/dL — ABNORMAL HIGH (ref 0.76–1.27)
Globulin, Total: 2.6 g/dL (ref 1.5–4.5)
Glucose: 94 mg/dL (ref 70–99)
Potassium: 4.1 mmol/L (ref 3.5–5.2)
Sodium: 141 mmol/L (ref 134–144)
Total Protein: 6.7 g/dL (ref 6.0–8.5)
eGFR: 50 mL/min/{1.73_m2} — ABNORMAL LOW (ref >59)

## 2023-09-27 LAB — CBC
Hematocrit: 43.6 % (ref 37.5–51.0)
Hemoglobin: 14.1 g/dL (ref 13.0–17.7)
MCH: 27.2 pg (ref 26.6–33.0)
MCHC: 32.3 g/dL (ref 31.5–35.7)
MCV: 84 fL (ref 79–97)
Platelets: 139 10*3/uL — ABNORMAL LOW (ref 150–450)
RBC: 5.19 x10E6/uL (ref 4.14–5.80)
RDW: 14.3 % (ref 11.6–15.4)
WBC: 4.5 10*3/uL (ref 3.4–10.8)

## 2023-09-27 LAB — TSH: TSH: 1.09 u[IU]/mL (ref 0.450–4.500)

## 2023-09-27 LAB — HEMOGLOBIN A1C
Est. average glucose Bld gHb Est-mCnc: 117 mg/dL
Hgb A1c MFr Bld: 5.7 % — ABNORMAL HIGH (ref 4.8–5.6)

## 2023-09-29 ENCOUNTER — Other Ambulatory Visit (HOSPITAL_COMMUNITY): Payer: Self-pay

## 2023-09-29 ENCOUNTER — Encounter: Payer: Self-pay | Admitting: *Deleted

## 2023-09-29 MED ORDER — ENTRESTO 97-103 MG PO TABS
1.0000 | ORAL_TABLET | Freq: Two times a day (BID) | ORAL | 3 refills | Status: AC
  Filled 2023-09-29: qty 180, 90d supply, fill #0
  Filled 2023-12-21: qty 180, 90d supply, fill #1
  Filled 2024-03-23: qty 180, 90d supply, fill #2
  Filled 2024-06-22: qty 180, 90d supply, fill #3

## 2023-09-29 MED ORDER — CARVEDILOL 25 MG PO TABS
25.0000 mg | ORAL_TABLET | Freq: Two times a day (BID) | ORAL | 3 refills | Status: AC
  Filled 2023-09-29: qty 180, 90d supply, fill #0
  Filled 2023-12-21: qty 180, 90d supply, fill #1
  Filled 2024-03-23: qty 180, 90d supply, fill #2
  Filled 2024-06-22: qty 180, 90d supply, fill #3

## 2023-12-22 ENCOUNTER — Other Ambulatory Visit (HOSPITAL_BASED_OUTPATIENT_CLINIC_OR_DEPARTMENT_OTHER): Payer: Self-pay

## 2024-01-07 ENCOUNTER — Other Ambulatory Visit: Payer: Self-pay | Admitting: Cardiology

## 2024-01-07 ENCOUNTER — Encounter: Payer: Self-pay | Admitting: Cardiology

## 2024-01-07 DIAGNOSIS — I5022 Chronic systolic (congestive) heart failure: Secondary | ICD-10-CM

## 2024-01-08 ENCOUNTER — Telehealth (HOSPITAL_COMMUNITY): Payer: Self-pay | Admitting: *Deleted

## 2024-01-08 NOTE — Telephone Encounter (Signed)
 Left instructions for amyloid study on vm.

## 2024-01-15 ENCOUNTER — Ambulatory Visit (HOSPITAL_COMMUNITY)
Admission: RE | Admit: 2024-01-15 | Discharge: 2024-01-15 | Disposition: A | Discharge status: Home / Self Care | Source: Ambulatory Visit | Attending: Cardiovascular Disease | Admitting: Cardiovascular Disease

## 2024-01-15 DIAGNOSIS — I5022 Chronic systolic (congestive) heart failure: Secondary | ICD-10-CM

## 2024-01-15 MED ORDER — TC 99M HYDROXYMETHYLENE DIPHOSPHONATE INJECTION
21.5000 | Freq: Once | INTRAVENOUS | Status: AC
  Administered 2024-01-15: 21.5 via INTRAVENOUS

## 2024-01-16 ENCOUNTER — Ambulatory Visit: Payer: Self-pay | Admitting: Cardiology

## 2024-01-16 LAB — MYOCARDIAL AMYLOID PLANAR & SPECT: H/CL Ratio: 1.28

## 2024-03-25 ENCOUNTER — Other Ambulatory Visit (HOSPITAL_COMMUNITY): Payer: Self-pay
